# Patient Record
Sex: Female | Born: 1960 | Race: Black or African American | Hispanic: No | Marital: Single | Smoking: Never smoker
Health system: Southern US, Community
[De-identification: ages and names within clinical notes are randomized; demographics above are authoritative.]

## PROBLEM LIST (undated history)

## (undated) DIAGNOSIS — C801 Malignant (primary) neoplasm, unspecified: Secondary | ICD-10-CM

## (undated) HISTORY — PX: BREAST SURGERY: SHX581

## (undated) HISTORY — DX: Malignant (primary) neoplasm, unspecified: C80.1

---

## 2003-03-05 HISTORY — PX: FRACTURE SURGERY: SHX138

## 2012-12-18 ENCOUNTER — Ambulatory Visit: Payer: Federal, State, Local not specified - PPO

## 2012-12-18 ENCOUNTER — Ambulatory Visit (INDEPENDENT_AMBULATORY_CARE_PROVIDER_SITE_OTHER): Payer: Federal, State, Local not specified - PPO | Admitting: Family Medicine

## 2012-12-18 VITALS — BP 136/88 | HR 97 | Temp 98.0°F | Resp 16 | Ht 63.0 in | Wt 246.0 lb

## 2012-12-18 DIAGNOSIS — M549 Dorsalgia, unspecified: Secondary | ICD-10-CM

## 2012-12-18 DIAGNOSIS — R079 Chest pain, unspecified: Secondary | ICD-10-CM

## 2012-12-18 MED ORDER — HYDROCODONE-ACETAMINOPHEN 5-325 MG PO TABS: 1.0000 | ORAL_TABLET | Freq: Three times a day (TID) | ORAL | Status: DC | PRN

## 2012-12-18 MED ORDER — TRAMADOL HCL 50 MG PO TABS: 50.0000 mg | ORAL_TABLET | Freq: Three times a day (TID) | ORAL | Status: DC | PRN

## 2012-12-18 MED ORDER — CYCLOBENZAPRINE HCL 10 MG PO TABS: 10.0000 mg | ORAL_TABLET | Freq: Two times a day (BID) | ORAL | Status: DC | PRN

## 2012-12-18 NOTE — Progress Notes (Signed)
[facility] [address], [place] [address] [phone]  Date:  12/18/2012   Name:  Virginia Wallace   DOB:  10/25/1960   MRN:  [id number]  PCP:  Provider Not In System    Chief Complaint: Arm Pain   History of Present Illness:  Virginia Wallace is a 52 y.o. very pleasant female patient who presents with the following:  She is here today with pain in her back for about 5 days.  She noted onset of pain when she wok up one morning and changed position.   She is a breast cancer survivor (left).  She was treated 15 years ago; she had a left mastectomy/ reconstruction.  She had chemo but no radiation.  Admits she has been worried that this pain could have something to do with her cancer.  Would like a CXR.    She has tried ice and some topical creams for her pain.    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  Sitting for a long time can exacerbate the pain.  The pain also seems to be related to position.  It hurts most when she lays down or when she is active.   No CP, no SOB.  No history of CAD or personal history of heart trouble  She has had tendonitis in the right hand for several months and wonders if this has caused her to change her body mechanics and injure her back.    Her mother died of CHF.  She was 34 when she died.    She has tried tramadol (from a friend) but did not have a good result with this.  Wants to try something else.    There are no active problems to display for this patient.   Past Medical History  Diagnosis Date  . Cancer     breast canser    Past Surgical History  Procedure Laterality Date  . Fracture surgery Right 2005    right ankle  . Breast surgery      History  Substance Use Topics  . Smoking status: Never Smoker   . Smokeless tobacco: Not on file  . Alcohol Use: No    Family History  Problem Relation Age of Onset  . Heart disease Mother   . Cancer Father     No Known Allergies  Medication list has been  reviewed and updated.  No current outpatient prescriptions on file prior to visit.   No current facility-administered medications on file prior to visit.    Review of Systems:  As per HPI- otherwise negative.   Physical Examination: Filed Vitals:   12/18/12 1614  BP: 148/92  Pulse: 103  Temp: 98 F (36.7 C)  Resp: 16   Filed Vitals:   12/18/12 1614  Height: 5\' 3"  (1.6 m)  Weight: 246 lb (111.585 kg)   Body mass index is 43.59 kg/(m^2). Ideal Body Weight: Weight in (lb) to have BMI = 25: 140.8  GEN: WDWN, NAD, Non-toxic, A & O x 3, obese HEENT: Atraumatic, Normocephalic. Neck supple. No masses, No LAD.  Bilateral TM wnl, oropharynx normal.  PEERL,EOMI.   Ears and Nose: No external deformity. CV: RRR, No M/G/R. No JVD. No thrill. No extra heart sounds. PULM: CTA B, no wheezes, crackles, rhonchi. No retractions. No resp. distress. No accessory muscle use. EXTR: No c/c/e NEURO Normal gait.  PSYCH: Normally interactive. Conversant. Not depressed or anxious appearing.  Calm demeanor.  She  is tender in the right upper back, between the spine and the medial border of the scapula.  Able to reproduce her pain by pressing on this area.  No redness, lesion, swelling or heat.  Negative right shoulder with normal ROM  [facility] reading (PRIMARY) by  Dr. Patsy Lager. CXR: clips from prior left mastectomy. Otherwise normal and negative   CLINICAL DATA: Chest and back pain.  EXAM: CHEST 2 VIEW  COMPARISON: None.  FINDINGS: The heart size and mediastinal contours are within normal limits. Both lungs are clear. The visualized skeletal structures are unremarkable. Surgical clips noted from previous left lymph node dissection.  IMPRESSION: No active cardiopulmonary disease.  EKG:  SR.  Non- specific ST changes.  Rate 89 BPM  Assessment and Plan: Pain in back - Plan: DG Chest 2 View, cyclobenzaprine (FLEXERIL) 10 MG tablet, HYDROcodone-acetaminophen (NORCO/VICODIN) 5-325 MG per tablet,  DISCONTINUED: traMADol (ULTRAM) 50 MG tablet  Chest pain - Plan: EKG 12-Lead  Upper back pain.  Will treat with flexeril for presumed muscle pain.  Also gave rx for vicodin to hold- she will use this only if flexeril not sufficient.  Advised her not to use them both together due to sedation risk  Discussed non- specific abnormality of her EKG.  She does not have any CP.  Discussed the fact that sometimes cardiac pain may manifest as pain in other areas of the chest or body.  Offered to facilitate an ED evaluation for cardiac rule- out.  She declines at this time but will seek care if she is not better with the flexeril.   Given her obesity, family history and non- specifically abnormal EKG will refer to cardiology next week for evaluation.  Explained to her that her cardiologist may want to order a stress test.    Of note she specifically denies CP, but I cannot remove this dx as it is associated with her EKG.   Signed Abbe Amsterdam, MD

## 2012-12-18 NOTE — Patient Instructions (Signed)
Use the flexeril as needed for pain and spasm.  You can use the hydrocodone if needed for more severe pain.  Be cautious of sedation especially if you use the medications together.   I will refer you to have a cardiac evaluation to ensure all is ok with your heart.

## 2013-03-24 ENCOUNTER — Ambulatory Visit (INDEPENDENT_AMBULATORY_CARE_PROVIDER_SITE_OTHER): Payer: Federal, State, Local not specified - PPO | Admitting: Emergency Medicine

## 2013-03-24 ENCOUNTER — Ambulatory Visit: Payer: Federal, State, Local not specified - PPO

## 2013-03-24 VITALS — BP 152/98 | HR 106 | Temp 99.3°F | Resp 18 | Ht 62.75 in | Wt 251.4 lb

## 2013-03-24 DIAGNOSIS — R059 Cough, unspecified: Secondary | ICD-10-CM

## 2013-03-24 DIAGNOSIS — J209 Acute bronchitis, unspecified: Secondary | ICD-10-CM

## 2013-03-24 DIAGNOSIS — R05 Cough: Secondary | ICD-10-CM

## 2013-03-24 MED ORDER — ALBUTEROL SULFATE HFA 108 (90 BASE) MCG/ACT IN AERS: 2.0000 | INHALATION_SPRAY | RESPIRATORY_TRACT | Status: AC | PRN

## 2013-03-24 MED ORDER — GUAIFENESIN-CODEINE 100-10 MG/5ML PO SOLN: 5.0000 mL | Freq: Three times a day (TID) | ORAL | Status: DC | PRN

## 2013-03-24 MED ORDER — AZITHROMYCIN 250 MG PO TABS: ORAL_TABLET | ORAL | Status: DC

## 2013-03-24 NOTE — Patient Instructions (Signed)
Metered Dose Inhaler (No Spacer Used) Inhaled medicines are the basis of asthma treatment and other breathing problems. Inhaled medicine can only be effective if used properly. Good technique assures that the medicine reaches the lungs. Metered dose inhalers (MDIs) are used to deliver a variety of inhaled medicines. These include quick relief or rescue medicines (such as bronchodilators) and controller medicines (such as corticosteroids). The medicine is delivered by pushing down on a metal canister to release a set amount of spray. If you are using different kinds of inhalers, use your quick relief medicine to open the airways 10 15 minutes before using a steroid if instructed to do so by your health care provider. If you are unsure which inhalers to use and the order of using them, ask your health care provider, nurse, or respiratory therapist. HOW TO USE THE INHALER 1. Remove cap from inhaler. 2. If you are using the inhaler for the first time, you will need to prime it. Shake the inhaler for 5 seconds and release four puffs into the air, away from your face. Ask your health care provider or pharmacist if you have questions about priming your inhaler. 3. Shake inhaler for 5 seconds before each breath in (inhalation). 4. Position the inhaler so that the top of the canister faces up. 5. Put your index finger on the top of the medicine canister. Your thumb supports the bottom of the inhaler. 6. Open your mouth. 7. Either place the inhaler between your teeth and place your lips tightly around the mouthpiece, or hold the inhaler 1 2 inches away from your open mouth. If you are unsure of which technique to use, ask your health care provider. 8. Breathe out (exhale) normally and as completely as possible. 9. Press the canister down with the index finger to release the medicine. 10. At the same time as the canister is pressed, inhale deeply and slowly until the lungs are completely filled. This should take  4 6 seconds. Keep your tongue down. 11. Hold the medicine in your lungs for up to 5 10 seconds (10 seconds is best). This helps the medicine get into the small airways of your lungs. 12. Breathe out slowly, through pursed lips. Whistling is an example of pursed lips. 13. Wait at least 1 minute between puffs. Continue with the above steps until you have taken the number of puffs your health care provider has ordered. Do not use the inhaler more than your health care provider directs you to. 14. Replace cap on inhaler. 15. Follow the directions from your health care provider or the inhaler insert for cleaning the inhaler. If you are using a steroid inhaler, rinse your mouth with water after your last puff, gargle, and spit out the water. Do not swallow the water. AVOID:  Inhaling before or after starting the spray of medicine. It takes practice to coordinate your breathing with triggering the spray.  Inhaling through the nose (rather than the mouth) when triggering the spray. HOW TO DETERMINE IF YOUR INHALER IS FULL OR NEARLY EMPTY You cannot know when an inhaler is empty by shaking it. A few inhalers are now being made with dose counters. Ask your health care provider for a prescription that has a dose counter if you feel you need that extra help. If your inhaler does not have a counter, ask your health care provider to help you determine the date you need to refill your inhaler. Write the refill date on a calendar or your inhaler canister.  Refill your inhaler 7 10 days before it runs out. Be sure to keep an adequate supply of medicine. This includes making sure it is not expired, and you have a spare inhaler.  SEEK MEDICAL CARE IF:   Symptoms are only partially relieved with your inhaler.  You are having trouble using your inhaler.  You experience some increase in phlegm. SEEK IMMEDIATE MEDICAL CARE IF:   You feel little or no relief with your inhalers. You are still wheezing and are feeling  shortness of breath or tightness in your chest or both.  You have dizziness, headaches, or fast heart rate.  You have chills, fever, or night sweats.  There is a noticeable increase in phlegm production, or there is blood in the phlegm. Document Released: 12/16/2006 Document Revised: 10/21/2012 Document Reviewed: 08/06/2012 [facility] Patient Information 2014 [place], [place].

## 2013-03-24 NOTE — Progress Notes (Signed)
[facility] [address] [place] [address] [phone]  Date:  03/24/2013   Name:  Virginia Wallace   DOB:  [birth year]   MRN:  [id number]  PCP:  Provider Not In System    Chief Complaint: Cough and Headache   History of Present Illness:  Virginia Wallace is a 53 y.o. very pleasant female patient who presents with the following:  Ill for three weeks duration cough with mucopurulent sputum  No wheezing or shortness of breath.  Has intermittent post nasal drainage.  No congestion or nasal drainage.  Cough is worse at night.  Fever one day last week with no chills. No nausea or vomiting.  No stool change or rash.  Has numerous sick contacts.  No improvement with over the counter medications or other home remedies. Denies other complaint or health concern today.   There are no active problems to display for this patient.   Past Medical History  Diagnosis Date  . Cancer     breast canser    Past Surgical History  Procedure Laterality Date  . Fracture surgery Right 2005    right ankle  . Breast surgery      History  Substance Use Topics  . Smoking status: Never Smoker   . Smokeless tobacco: Not on file  . Alcohol Use: No    Family History  Problem Relation Age of Onset  . Heart disease Mother   . Cancer Father     No Known Allergies  Medication list has been reviewed and updated.  Current Outpatient Prescriptions on File Prior to Visit  Medication Sig Dispense Refill  . cyclobenzaprine (FLEXERIL) 10 MG tablet Take 1 tablet (10 mg total) by mouth 2 (two) times daily as needed for muscle spasms.  30 tablet  0  . HYDROcodone-acetaminophen (NORCO/VICODIN) 5-325 MG per tablet Take 1 tablet by mouth every 8 (eight) hours as needed for pain.  20 tablet  0   No current facility-administered medications on file prior to visit.    Review of Systems:  As per HPI, otherwise negative.    Physical Examination: Filed Vitals:   03/24/13 1646  BP:  152/98  Pulse: 106  Temp: 99.3 F (37.4 C)  Resp: 18   Filed Vitals:   03/24/13 1646  Height: 5' 2.75" (1.594 m)  Weight: 251 lb 6.4 oz (114.034 kg)   Body mass index is 44.88 kg/(m^2). Ideal Body Weight: Weight in (lb) to have BMI = 25: 139.7  GEN: veery obese, NAD, Non-toxic, A & O x 3 HEENT: Atraumatic, Normocephalic. Neck supple. No masses, No LAD. Ears and Nose: No external deformity. CV: RRR, No M/G/R. No JVD. No thrill. No extra heart sounds. PULM: CTA B, no wheezes, crackles, rhonchi. No retractions. No resp. distress. No accessory muscle use. ABD: S, NT, ND, +BS. No rebound. No HSM. EXTR: No c/c/e NEURO Normal gait.  PSYCH: Normally interactive. Conversant. Not depressed or anxious appearing.  Calm demeanor.    Assessment and Plan: Bronchitis with bronchospasm Albuterol zpak Robitussin AC   Signed,  Ellison Carwin, MD

## 2020-12-21 ENCOUNTER — Other Ambulatory Visit: Payer: Self-pay

## 2020-12-21 ENCOUNTER — Inpatient Hospital Stay (HOSPITAL_COMMUNITY)
Admission: EM | Admit: 2020-12-21 | Discharge: 2020-12-24 | DRG: 175 | Disposition: A | Discharge status: Home / Self Care | Payer: Federal, State, Local not specified - PPO | Attending: Internal Medicine | Admitting: Internal Medicine

## 2020-12-21 ENCOUNTER — Emergency Department (HOSPITAL_COMMUNITY): Payer: Federal, State, Local not specified - PPO

## 2020-12-21 DIAGNOSIS — Z79899 Other long term (current) drug therapy: Secondary | ICD-10-CM

## 2020-12-21 DIAGNOSIS — E66813 Obesity, class 3: Secondary | ICD-10-CM

## 2020-12-21 DIAGNOSIS — R778 Other specified abnormalities of plasma proteins: Secondary | ICD-10-CM | POA: Diagnosis not present

## 2020-12-21 DIAGNOSIS — Z9013 Acquired absence of bilateral breasts and nipples: Secondary | ICD-10-CM | POA: Diagnosis not present

## 2020-12-21 DIAGNOSIS — R7989 Other specified abnormal findings of blood chemistry: Secondary | ICD-10-CM | POA: Diagnosis present

## 2020-12-21 DIAGNOSIS — I2694 Multiple subsegmental pulmonary emboli without acute cor pulmonale: Secondary | ICD-10-CM

## 2020-12-21 DIAGNOSIS — Z20822 Contact with and (suspected) exposure to covid-19: Secondary | ICD-10-CM | POA: Diagnosis present

## 2020-12-21 DIAGNOSIS — Z6841 Body Mass Index (BMI) 40.0 and over, adult: Secondary | ICD-10-CM

## 2020-12-21 DIAGNOSIS — I2699 Other pulmonary embolism without acute cor pulmonale: Secondary | ICD-10-CM | POA: Diagnosis present

## 2020-12-21 DIAGNOSIS — J4540 Moderate persistent asthma, uncomplicated: Secondary | ICD-10-CM | POA: Diagnosis present

## 2020-12-21 DIAGNOSIS — Z8249 Family history of ischemic heart disease and other diseases of the circulatory system: Secondary | ICD-10-CM | POA: Diagnosis not present

## 2020-12-21 DIAGNOSIS — Z853 Personal history of malignant neoplasm of breast: Secondary | ICD-10-CM

## 2020-12-21 DIAGNOSIS — I2609 Other pulmonary embolism with acute cor pulmonale: Principal | ICD-10-CM | POA: Diagnosis present

## 2020-12-21 LAB — CBC WITH DIFFERENTIAL/PLATELET
Abs Immature Granulocytes: 0.03 10*3/uL (ref 0.00–0.07)
Basophils Absolute: 0 10*3/uL (ref 0.0–0.1)
Basophils Relative: 0 %
Eosinophils Absolute: 0 10*3/uL (ref 0.0–0.5)
Eosinophils Relative: 1 %
HCT: 37.3 % (ref 36.0–46.0)
Hemoglobin: 12.5 g/dL (ref 12.0–15.0)
Immature Granulocytes: 0 %
Lymphocytes Relative: 29 %
Lymphs Abs: 2.5 10*3/uL (ref 0.7–4.0)
MCH: 25.4 pg — ABNORMAL LOW (ref 26.0–34.0)
MCHC: 33.5 g/dL (ref 30.0–36.0)
MCV: 75.8 fL — ABNORMAL LOW (ref 80.0–100.0)
Monocytes Absolute: 0.4 10*3/uL (ref 0.1–1.0)
Monocytes Relative: 5 %
Neutro Abs: 5.6 10*3/uL (ref 1.7–7.7)
Neutrophils Relative %: 65 %
Platelets: 244 10*3/uL (ref 150–400)
RBC: 4.92 MIL/uL (ref 3.87–5.11)
RDW: 14.6 % (ref 11.5–15.5)
WBC: 8.6 10*3/uL (ref 4.0–10.5)
nRBC: 0 % (ref 0.0–0.2)

## 2020-12-21 LAB — COMPREHENSIVE METABOLIC PANEL
ALT: 24 U/L (ref 0–44)
AST: 27 U/L (ref 15–41)
Albumin: 3.8 g/dL (ref 3.5–5.0)
Alkaline Phosphatase: 63 U/L (ref 38–126)
Anion gap: 11 (ref 5–15)
BUN: 10 mg/dL (ref 8–23)
CO2: 26 mmol/L (ref 22–32)
Calcium: 9.5 mg/dL (ref 8.9–10.3)
Chloride: 104 mmol/L (ref 98–111)
Creatinine, Ser: 0.88 mg/dL (ref 0.44–1.00)
GFR, Estimated: >60 mL/min (ref >60)
Glucose, Bld: 134 mg/dL — ABNORMAL HIGH (ref 70–99)
Potassium: 4.1 mmol/L (ref 3.5–5.1)
Sodium: 141 mmol/L (ref 135–145)
Total Bilirubin: 0.5 mg/dL (ref 0.3–1.2)
Total Protein: 7.3 g/dL (ref 6.5–8.1)

## 2020-12-21 LAB — BRAIN NATRIURETIC PEPTIDE: B Natriuretic Peptide: 349.9 pg/mL — ABNORMAL HIGH (ref 0.0–100.0)

## 2020-12-21 LAB — TROPONIN I (HIGH SENSITIVITY): Troponin I (High Sensitivity): 204 ng/L (ref <18)

## 2020-12-21 MED ORDER — ASPIRIN 81 MG PO CHEW
324.0000 mg | CHEWABLE_TABLET | Freq: Once | ORAL | Status: AC
  Administered 2020-12-21: 324 mg via ORAL
  Filled 2020-12-21: qty 4

## 2020-12-21 MED ORDER — HEPARIN (PORCINE) 25000 UT/250ML-% IV SOLN
1250.0000 [IU]/h | INTRAVENOUS | Status: AC
  Administered 2020-12-21: 1350 [IU]/h via INTRAVENOUS
  Administered 2020-12-22 – 2020-12-23 (×2): 1250 [IU]/h via INTRAVENOUS
  Filled 2020-12-21 (×3): qty 250

## 2020-12-21 MED ORDER — HEPARIN BOLUS VIA INFUSION
5500.0000 [IU] | Freq: Once | INTRAVENOUS | Status: AC
  Administered 2020-12-21: 5500 [IU] via INTRAVENOUS
  Filled 2020-12-21: qty 5500

## 2020-12-21 MED ORDER — IOHEXOL 350 MG/ML SOLN
80.0000 mL | Freq: Once | INTRAVENOUS | Status: AC | PRN
  Administered 2020-12-21: 80 mL via INTRAVENOUS

## 2020-12-21 NOTE — Progress Notes (Signed)
ANTICOAGULATION CONSULT NOTE - Initial Consult  Pharmacy Consult for heparin dosing  Indication: NSTEMI vs PE No Known Allergies  Patient Measurements: Height: 5\' 3"  (160 cm) Weight: 114 kg (251 lb 5.2 oz) IBW/kg (Calculated) : 52.4 Heparin Dosing Weight: 80kg  Vital Signs: Temp: 99.1 F (37.3 C) (10/20 2152) Temp Source: Oral (10/20 2152) BP: 125/89 (10/20 2225) Pulse Rate: 95 (10/20 2225)  Labs: Recent Labs    12/21/20 2046  HGB 12.5  HCT 37.3  PLT 244  CREATININE 0.88  TROPONINIHS 204*    Estimated Creatinine Clearance: 81.6 mL/min (by C-G formula based on SCr of 0.88 mg/dL).   Medical History: Past Medical History:  Diagnosis Date   Cancer ([place])    breast canser    Medications:  See MAR  Assessment: Patient presented with shortness of breath and epigastric tenderness. Hx of PE. SCr 0.88, CBC WNL, and troponin 204. No PTA anticoagulation since December 2021, no bleeding noted.   Goal of Therapy:  Heparin level 0.3-0.7 units/ml Monitor platelets by anticoagulation protocol: Yes   Plan:  Give 5500 units bolus x 1 Start heparin infusion at 1350 units/hr Check anti-Xa level in 6 hours and daily while on heparin Continue to monitor H&H and platelets  Donald Pore 12/21/2020,11:00 PM

## 2020-12-21 NOTE — ED Notes (Signed)
Patient oxygen 89-90% RA. Placed pt on 2L O2 per triage RN.

## 2020-12-21 NOTE — ED Triage Notes (Addendum)
Pt reports shortness of breath upon exertion since yesterday. Pt also reports low O2 sats.

## 2020-12-21 NOTE — ED Provider Notes (Signed)
Emergency Medicine Provider Triage Evaluation Note  Virginia Wallace , a 60 y.o. female  was evaluated in triage.  Pt complains of shortness of breath.  Patient is been feeling short of breath for the last week, worsened acutely yesterday.  She is also having some pain in the epigastric area and feels like there is some labor in her chest.  Denies any actual pain or discomfort.  No nausea or vomiting, patient is usually not on oxygen.  She did have supplemental oxygen for almost a year after COVID but was recently taken off it a few months ago.  She does have a history of PE, she has been off anticoagulation since December of last year..  Review of Systems  Positive: Shortness of breath, epigastric tenderness Negative:   Physical Exam  BP (!) 165/121 (BP Location: Left Arm)   Pulse (!) 125   Temp 97.6 F (36.4 C) (Oral)   Resp (!) 24   Ht 5\' 3"  (1.6 m)   Wt 114 kg   SpO2 90%   BMI 44.52 kg/m  Gen:   Awake, no distress   Resp:  Normal effort  MSK:   Moves extremities without difficulty  Other:  Patient is hypoxic in the waiting room and tachycardic to 125.  Medical Decision Making  Medically screening exam initiated at 8:38 PM.  Appropriate orders placed.  Merril Abbe was informed that the remainder of the evaluation will be completed by another provider, this initial triage assessment does not replace that evaluation, and the importance of remaining in the ED until their evaluation is complete.  Concern for PE.  Patient is having epigastric tenderness as well, the pain does not radiate to the back so I do not think there is a dissection.   Sherrill Raring, PA-C 12/21/20 2039    Lorelle Gibbs, DO 12/21/20 2356

## 2020-12-21 NOTE — ED Notes (Signed)
Pt transported to CT ?

## 2020-12-21 NOTE — ED Provider Notes (Signed)
[place] EMERGENCY DEPARTMENT Provider Note   CSN: [id number] Arrival date & time: 12/21/20  2011     History Chief Complaint  Patient presents with   Shortness of Breath    Virginia Wallace is a 60 y.o. female who presents with concern for shortness of breath.  Endorses 5 days of shortness of breath but only 2 days of significant decrease in her stamina.  Significant shortness of breath with even small amounts of exertion or very short distances of ambulation.  States that she was concerned she had a low oxygen saturation.  I personally reviewed this patient's medical records.  She has a history of breast cancer status post chemotherapy and mastectomy.  Additionally had pulmonary emboli a year ago; completed course of anticoagulation is no longer anticoagulated.  HPI     Past Medical History:  Diagnosis Date   Cancer [facility])    breast canser    Patient Active Problem List   Diagnosis Date Noted   Bilateral pulmonary embolism ([place]) 12/21/2020    Past Surgical History:  Procedure Laterality Date   BREAST SURGERY     FRACTURE SURGERY Right 2005   right ankle     OB History   No obstetric history on file.     Family History  Problem Relation Age of Onset   Heart disease Mother    Cancer Father     Social History   Tobacco Use   Smoking status: Never  Substance Use Topics   Alcohol use: No   Drug use: No    Home Medications Prior to Admission medications   Medication Sig Start Date End Date Taking? Authorizing Provider  albuterol (PROVENTIL HFA;VENTOLIN HFA) 108 (90 BASE) MCG/ACT inhaler Inhale 2 puffs into the lungs every 4 (four) hours as needed for wheezing or shortness of breath (cough, shortness of breath or wheezing.). 03/24/13   [name], MD  azithromycin (ZITHROMAX) 250 MG tablet Take 2 tabs PO x 1 dose, then 1 tab PO QD x 4 days 03/24/13   [name], MD  cyclobenzaprine (FLEXERIL) 10 MG tablet Take 1 tablet (10  mg total) by mouth 2 (two) times daily as needed for muscle spasms. 12/18/12   Copland, [name], MD  guaiFENesin-codeine 100-10 MG/5ML syrup Take 5-10 mLs by mouth 3 (three) times daily as needed for cough. 03/24/13   [name], MD  guaiFENesin-dextromethorphan [facility] DM) 100-10 MG/5ML syrup Take 5 mLs by mouth every 4 (four) hours as needed for cough.    [provider]  HYDROcodone-acetaminophen (NORCO/VICODIN) 5-325 MG per tablet Take 1 tablet by mouth every 8 (eight) hours as needed for pain. 12/18/12   Copland, [name], MD    Allergies    Patient has no known allergies.  Review of Systems   Review of Systems  Constitutional:  Positive for activity change and fatigue. Negative for appetite change, chills, diaphoresis and fever.  Respiratory:  Positive for shortness of breath. Negative for cough and chest tightness.   Cardiovascular: Negative.   Gastrointestinal: Negative.   Genitourinary: Negative.   Musculoskeletal: Negative.   Neurological: Negative.    Physical Exam Updated Vital Signs BP (!) 138/97   Pulse 91   Temp 99.1 F (37.3 C) (Oral)   Resp (!) 21   Ht 5\' 3"  (1.6 m)   Wt 114 kg   SpO2 99%   BMI 44.52 kg/m   Physical Exam Vitals and nursing note reviewed.  Constitutional:  Appearance: She is obese. She is not ill-appearing or toxic-appearing.  HENT:     Head: Normocephalic and atraumatic.     Nose: Nose normal.     Mouth/Throat:     Mouth: Mucous membranes are moist.     Pharynx: Oropharynx is clear. Uvula midline. No oropharyngeal exudate or posterior oropharyngeal erythema.     Tonsils: No tonsillar exudate.  Eyes:     General: Lids are normal. Vision grossly intact.        Right eye: No discharge.        Left eye: No discharge.     Extraocular Movements: Extraocular movements intact.     Conjunctiva/sclera: Conjunctivae normal.     Pupils: Pupils are equal, round, and reactive to light.  Neck:     Trachea: Trachea and  phonation normal.  Cardiovascular:     Rate and Rhythm: Regular rhythm. Tachycardia present.     Pulses: Normal pulses.     Heart sounds: Normal heart sounds. No murmur heard. Pulmonary:     Effort: Pulmonary effort is normal. No tachypnea, bradypnea, accessory muscle usage, prolonged expiration or respiratory distress.     Breath sounds: Normal breath sounds. No wheezing or rales.  Chest:     Chest wall: No mass, lacerations, deformity, swelling, tenderness, crepitus or edema.  Abdominal:     General: Bowel sounds are normal. There is no distension.     Palpations: Abdomen is soft.     Tenderness: There is no abdominal tenderness. There is no right CVA tenderness, left CVA tenderness, guarding or rebound.  Musculoskeletal:        General: No deformity.     Cervical back: Normal range of motion and neck supple. No edema, rigidity or crepitus. No pain with movement, spinous process tenderness or muscular tenderness.     Right lower leg: No edema.     Left lower leg: No edema.  Lymphadenopathy:     Cervical: No cervical adenopathy.  Skin:    General: Skin is warm and dry.     Capillary Refill: Capillary refill takes less than 2 seconds.  Neurological:     General: No focal deficit present.     Mental Status: She is alert and oriented to person, place, and time. Mental status is at baseline.     GCS: GCS eye subscore is 4. GCS verbal subscore is 5. GCS motor subscore is 6.     Gait: Gait is intact. Gait normal.  Psychiatric:        Mood and Affect: Mood normal.    ED Results / Procedures / Treatments   Labs (all labs ordered are listed, but only abnormal results are displayed) Labs Reviewed  COMPREHENSIVE METABOLIC PANEL - Abnormal; Notable for the following components:      Result Value   Glucose, Bld 134 (*)    All other components within normal limits  CBC WITH DIFFERENTIAL/PLATELET - Abnormal; Notable for the following components:   MCV 75.8 (*)    MCH 25.4 (*)    All  other components within normal limits  BRAIN NATRIURETIC PEPTIDE - Abnormal; Notable for the following components:   B Natriuretic Peptide 349.9 (*)    All other components within normal limits  TROPONIN I (HIGH SENSITIVITY) - Abnormal; Notable for the following components:   Troponin I (High Sensitivity) 204 (*)    All other components within normal limits  TROPONIN I (HIGH SENSITIVITY) - Abnormal; Notable for the following components:   Troponin I (  High Sensitivity) 220 (*)    All other components within normal limits  RESP PANEL BY RT-PCR (FLU A&B, COVID) ARPGX2  HEPARIN LEVEL (UNFRACTIONATED)  CBC  PROTIME-INR    EKG EKG Interpretation  Date/Time:  Thursday December 21 2020 20:39:18 EDT Ventricular Rate:  112 PR Interval:  164 QRS Duration: 76 QT Interval:  342 QTC Calculation: 466 R Axis:   -31 Text Interpretation: Sinus tachycardia Possible Left atrial enlargement Left axis deviation Pulmonary disease pattern T wave abnormality, consider anterior ischemia Abnormal ECG No previous ECGs available Confirmed by Fredia Sorrow [phone]) on 12/21/2020 10:13:20 PM  Radiology CT Angio Chest PE W and/or Wo Contrast  Result Date: 12/21/2020 CLINICAL DATA:  PE suspected, high prob EXAM: CT ANGIOGRAPHY CHEST WITH CONTRAST TECHNIQUE: Multidetector CT imaging of the chest was performed using the standard protocol during bolus administration of intravenous contrast. Multiplanar CT image reconstructions and MIPs were obtained to evaluate the vascular anatomy. CONTRAST:  67mL OMNIPAQUE IOHEXOL 350 MG/ML SOLN COMPARISON:  None. FINDINGS: Cardiovascular: There are numerous bilateral pulmonary emboli involving all lobes of both lungs. Evidence of right heart strain with RV to LV ratio 1.59. Aorta normal caliber. Mediastinum/Nodes: No mediastinal, hilar, or axillary adenopathy. Trachea and esophagus are unremarkable. Lungs/Pleura: No confluent airspace opacities. Linear atelectasis in the lung bases.  No effusions. Upper Abdomen: Imaging into the upper abdomen demonstrates no acute findings. Musculoskeletal: Prior right mastectomy.  No acute bony abnormality. Review of the MIP images confirms the above findings. IMPRESSION: Bilateral pulmonary emboli. Evidence of right heart strain (RV/LV Ratio = 1.59) consistent with at least submassive (intermediate risk) PE. The presence of right heart strain has been associated with an increased risk of morbidity and mortality. Please refer to the "PE Focused" order set in EPIC. Bibasilar atelectasis. Critical Value/emergent results were called by telephone at the time of interpretation on 12/21/2020 at 11:24 pm to provider Joline Maxcy , who verbally acknowledged these results. Electronically Signed   By: Rolm Baptise M.D.   On: 12/21/2020 23:29    Procedures .Critical Care Performed by: Emeline Darling, PA-C Authorized by: Emeline Darling, PA-C   Critical care provider statement:    Critical care time (minutes):  45   Critical care was necessary to treat or prevent imminent or life-threatening deterioration of the following conditions: Submassive PE with right heart strain.   Critical care was time spent personally by me on the following activities:  Development of treatment plan with patient or surrogate, discussions with consultants, evaluation of patient's response to treatment, examination of patient, obtaining history from patient or surrogate, ordering and performing treatments and interventions, ordering and review of laboratory studies, ordering and review of radiographic studies, pulse oximetry and re-evaluation of patient's condition   Medications Ordered in ED Medications  heparin ADULT infusion 100 units/mL (25000 units/269mL) (1,350 Units/hr Intravenous New Bag/Given 12/21/20 2331)  aspirin chewable tablet 324 mg (324 mg Oral Given 12/21/20 2257)  iohexol (OMNIPAQUE) 350 MG/ML injection 80 mL (80 mLs Intravenous Contrast Given  12/21/20 2319)  heparin bolus via infusion 5,500 Units (5,500 Units Intravenous Bolus from Bag 12/21/20 2331)    ED Course  I have reviewed the triage vital signs and the nursing notes.  Pertinent labs & imaging results that were available during my care of the patient were reviewed by me and considered in my medical decision making (see chart for details).  Clinical Course as of 12/22/20 0025  Thu Dec 21, 2020  2323 Patient ambulated to the  bathroom and became exceptionally short of breath, desatted to the 80s.  Recovered her saturations on 3 L submental oxygen by nasal cannula.  Still feeling somewhat short of breath but no chest pain. [RS]  2338 Call from ED PA Cal[place] who reported she received critical results from radiologist regarding this patient.  Patient has bilateral pulmonary emboli; submassive PE with right heart strain on her CT angiogram.  I called ED pharmacist to confirm that patient is on the appropriate heparin dosage for PE. [RS]  2353 Consult to hospitalist, Dr. Cyd Silence, who is agreeable to seeing the patient and admitting her to his service today appreciate his collaboration in the care of this patient. [RS]    Clinical Course User Index [RS] Valoria Tamburri, Sharlene Dory   MDM Rules/Calculators/A&P                         60 year old female who presents with shortness of breath x5 days worse in the last 2 days.  DDx includes but is not limited to ACS, PE, pleural effusion, pneumothorax, pneumonia, reactive airway disease, CHF exacerbation.  CBC unremarkable, CMP unremarkable,  BNP mildly elevated to 349.  Troponin significantly elevated 204.  Patient has been chest pain-free throughout her course of symptomatology.  Additionally is tachycardic and tachypneic throughout her stay in the ED.  Favor diagnosis of PE.  CT angiogram ordered; Heparin ordered per pharmacy consult with instructions of high suspicion of PE.  Critical result with submassive bilateral  pulmonary emboli with right heart strain on CT angiogram.  Patient pending admission consult at this time.  Heparin has been started.  Consult to hospitalist as above.  Medical evaluation and treatment plan.  Each of her questions was answered to her expressed satisfaction.  Consent will plan for management this time.    This chart was dictated using voice recognition software, Dragon. Despite the best efforts of this provider to proofread and correct errors, errors may still occur which can change documentation meaning.  Final Clinical Impression(s) / ED Diagnoses Final diagnoses:  None    Rx / DC Orders ED Discharge Orders     None        Emeline Darling, PA-C 12/22/20 Crista Elliot, MD 12/27/20 [phone]

## 2020-12-22 ENCOUNTER — Encounter (HOSPITAL_COMMUNITY): Payer: Self-pay | Admitting: Internal Medicine

## 2020-12-22 ENCOUNTER — Inpatient Hospital Stay (HOSPITAL_COMMUNITY): Payer: Federal, State, Local not specified - PPO

## 2020-12-22 DIAGNOSIS — I2609 Other pulmonary embolism with acute cor pulmonale: Principal | ICD-10-CM

## 2020-12-22 DIAGNOSIS — R778 Other specified abnormalities of plasma proteins: Secondary | ICD-10-CM | POA: Diagnosis not present

## 2020-12-22 DIAGNOSIS — J4540 Moderate persistent asthma, uncomplicated: Secondary | ICD-10-CM | POA: Diagnosis not present

## 2020-12-22 DIAGNOSIS — R7989 Other specified abnormal findings of blood chemistry: Secondary | ICD-10-CM | POA: Diagnosis present

## 2020-12-22 DIAGNOSIS — Z6841 Body Mass Index (BMI) 40.0 and over, adult: Secondary | ICD-10-CM

## 2020-12-22 DIAGNOSIS — I2699 Other pulmonary embolism without acute cor pulmonale: Secondary | ICD-10-CM

## 2020-12-22 HISTORY — DX: Moderate persistent asthma, uncomplicated: J45.40

## 2020-12-22 HISTORY — DX: Morbid (severe) obesity due to excess calories: E66.01

## 2020-12-22 HISTORY — DX: Body Mass Index (BMI) 40.0 and over, adult: Z684

## 2020-12-22 LAB — CBC
HCT: 33.9 % — ABNORMAL LOW (ref 36.0–46.0)
Hemoglobin: 11.6 g/dL — ABNORMAL LOW (ref 12.0–15.0)
MCH: 25.5 pg — ABNORMAL LOW (ref 26.0–34.0)
MCHC: 34.2 g/dL (ref 30.0–36.0)
MCV: 74.5 fL — ABNORMAL LOW (ref 80.0–100.0)
Platelets: 205 10*3/uL (ref 150–400)
RBC: 4.55 MIL/uL (ref 3.87–5.11)
RDW: 14.6 % (ref 11.5–15.5)
WBC: 8.5 10*3/uL (ref 4.0–10.5)
nRBC: 0 % (ref 0.0–0.2)

## 2020-12-22 LAB — RESP PANEL BY RT-PCR (FLU A&B, COVID) ARPGX2
Influenza A by PCR: NEGATIVE
Influenza B by PCR: NEGATIVE
SARS Coronavirus 2 by RT PCR: NEGATIVE

## 2020-12-22 LAB — PROTIME-INR
INR: 1 (ref 0.8–1.2)
Prothrombin Time: 12.9 seconds (ref 11.4–15.2)

## 2020-12-22 LAB — COMPREHENSIVE METABOLIC PANEL
ALT: 20 U/L (ref 0–44)
AST: 20 U/L (ref 15–41)
Albumin: 3.3 g/dL — ABNORMAL LOW (ref 3.5–5.0)
Alkaline Phosphatase: 53 U/L (ref 38–126)
Anion gap: 10 (ref 5–15)
BUN: 8 mg/dL (ref 8–23)
CO2: 26 mmol/L (ref 22–32)
Calcium: 8.9 mg/dL (ref 8.9–10.3)
Chloride: 104 mmol/L (ref 98–111)
Creatinine, Ser: 0.76 mg/dL (ref 0.44–1.00)
GFR, Estimated: >60 mL/min (ref >60)
Glucose, Bld: 104 mg/dL — ABNORMAL HIGH (ref 70–99)
Potassium: 3.6 mmol/L (ref 3.5–5.1)
Sodium: 140 mmol/L (ref 135–145)
Total Bilirubin: 0.9 mg/dL (ref 0.3–1.2)
Total Protein: 6.5 g/dL (ref 6.5–8.1)

## 2020-12-22 LAB — ECHOCARDIOGRAM COMPLETE
Area-P 1/2: 3.53 cm2
Height: 63 in
S' Lateral: 2.2 cm
Weight: 4021.19 oz

## 2020-12-22 LAB — HIV ANTIBODY (ROUTINE TESTING W REFLEX): HIV Screen 4th Generation wRfx: NONREACTIVE

## 2020-12-22 LAB — HEPARIN LEVEL (UNFRACTIONATED)
Heparin Unfractionated: 0.9 IU/mL — ABNORMAL HIGH (ref 0.30–0.70)
Heparin Unfractionated: 0.62 IU/mL (ref 0.30–0.70)

## 2020-12-22 LAB — TROPONIN I (HIGH SENSITIVITY): Troponin I (High Sensitivity): 220 ng/L (ref <18)

## 2020-12-22 LAB — MAGNESIUM: Magnesium: 1.6 mg/dL — ABNORMAL LOW (ref 1.7–2.4)

## 2020-12-22 MED ORDER — ACETAMINOPHEN 650 MG RE SUPP: 650.0000 mg | Freq: Four times a day (QID) | RECTAL | Status: DC | PRN

## 2020-12-22 MED ORDER — FERROUS SULFATE 325 (65 FE) MG PO TABS
325.0000 mg | ORAL_TABLET | Freq: Every day | ORAL | Status: DC
  Administered 2020-12-23 – 2020-12-24 (×2): 325 mg via ORAL
  Filled 2020-12-22 (×2): qty 1

## 2020-12-22 MED ORDER — ONDANSETRON HCL 4 MG PO TABS: 4.0000 mg | ORAL_TABLET | Freq: Four times a day (QID) | ORAL | Status: DC | PRN

## 2020-12-22 MED ORDER — ACETAMINOPHEN 325 MG PO TABS: 650.0000 mg | ORAL_TABLET | Freq: Four times a day (QID) | ORAL | Status: DC | PRN

## 2020-12-22 MED ORDER — MORPHINE SULFATE (PF) 2 MG/ML IV SOLN: 2.0000 mg | INTRAVENOUS | Status: DC | PRN

## 2020-12-22 MED ORDER — MELATONIN 5 MG PO TABS
10.0000 mg | ORAL_TABLET | Freq: Every evening | ORAL | Status: DC | PRN
Start: 2020-12-22 — End: 2020-12-24

## 2020-12-22 MED ORDER — POLYETHYLENE GLYCOL 3350 17 G PO PACK: 17.0000 g | PACK | Freq: Every day | ORAL | Status: DC | PRN

## 2020-12-22 MED ORDER — ONDANSETRON HCL 4 MG/2ML IJ SOLN: 4.0000 mg | Freq: Four times a day (QID) | INTRAMUSCULAR | Status: DC | PRN

## 2020-12-22 MED ORDER — ALBUTEROL SULFATE (2.5 MG/3ML) 0.083% IN NEBU
2.5000 mg | INHALATION_SOLUTION | RESPIRATORY_TRACT | Status: DC | PRN
Start: 2020-12-22 — End: 2020-12-24

## 2020-12-22 MED ORDER — FLUTICASONE FUROATE-VILANTEROL 100-25 MCG/ACT IN AEPB
1.0000 | INHALATION_SPRAY | Freq: Every day | RESPIRATORY_TRACT | Status: DC
  Administered 2020-12-23 – 2020-12-24 (×2): 1 via RESPIRATORY_TRACT
  Filled 2020-12-22 (×2): qty 28

## 2020-12-22 MED ORDER — MAGNESIUM SULFATE 2 GM/50ML IV SOLN
2.0000 g | Freq: Once | INTRAVENOUS | Status: AC
  Administered 2020-12-22: 2 g via INTRAVENOUS
  Filled 2020-12-22: qty 50

## 2020-12-22 MED ORDER — MAGNESIUM OXIDE -MG SUPPLEMENT 400 (240 MG) MG PO TABS
400.0000 mg | ORAL_TABLET | Freq: Every day | ORAL | Status: DC
  Administered 2020-12-22 – 2020-12-24 (×3): 400 mg via ORAL
  Filled 2020-12-22 (×3): qty 1

## 2020-12-22 MED ORDER — OXYCODONE-ACETAMINOPHEN 5-325 MG PO TABS: 1.0000 | ORAL_TABLET | ORAL | Status: DC | PRN

## 2020-12-22 NOTE — Assessment & Plan Note (Addendum)
   CT angiogram reveals evidence of bilateral pulmonary emboli with evidence of right heart strain.  Patient is currently hemodynamically stable and is on minimal oxygen supplementation via nasal cannula.  No need for emergent thrombectomy or thrombolysis at this point.  Patient has been placed on Heparin infusion by ER provider which will be continued at this time and eventually will be transition to long-term oral anticoagulation.  Concerning potential etiologies patient does endorse frequently driving back and forth between [place] (90 minutes) but otherwise denies recent lengthy trips, recent medical procedures, recent unexplained weight loss or night sweats.  Patient states that she has been following with oncology regularly and has had no evidence of recurrent cancer on surveillance.  Patient possibly suffers from an underlying hypercoagulable state considering this is the second time patient has developed pulmonary emboli.  Patient will likely require lifelong anticoagulation.  Echocardiogram will be obtained in the morning, if right heart strain is once again demonstrated will obtain PCCM consultation for evaluation of potential thrombolysis or thrombectomy.    Monitoring patient on telemetry  Monitoring cardiac enzymes which are somewhat elevated due to heart strain  Supplemental oxygen to maintain oxygen saturations between 92 to 96%.  As needed opiate-based analgesics for associated chest pain

## 2020-12-22 NOTE — ED Notes (Signed)
Lunch ordered 

## 2020-12-22 NOTE — Assessment & Plan Note (Signed)
?   No evidence of acute asthma exacerbation ?? As needed bronchodilator therapy for shortness of breath and wheezing. ? ?

## 2020-12-22 NOTE — Progress Notes (Signed)
ANTICOAGULATION CONSULT NOTE Pharmacy Consult for heparin   Indication:  PE No Known Allergies  Patient Measurements: Height: 5\' 3"  (160 cm) Weight: 105.3 kg (232 lb 2.3 oz) IBW/kg (Calculated) : 52.4 Heparin Dosing Weight: 80kg  Vital Signs: Temp: 97.7 F (36.5 C) (10/21 1338) Temp Source: Oral (10/21 1338) BP: 110/74 (10/21 1338) Pulse Rate: 98 (10/21 1338)  Labs: Recent Labs    12/21/20 2046 12/21/20 2238 12/22/20 0337 12/22/20 0530 12/22/20 1330  HGB 12.5  --  11.6*  --   --   HCT 37.3  --  33.9*  --   --   PLT 244  --  205  --   --   LABPROT  --   --   --   --  12.9  INR  --   --   --   --  1.0  HEPARINUNFRC  --   --   --  0.90* 0.62  CREATININE 0.88  --  0.76  --   --   TROPONINIHS 204* 220*  --   --   --      Estimated Creatinine Clearance: 85.8 mL/min (by C-G formula based on SCr of 0.76 mg/dL).  Assessment: 60 y.o. female with bilateral PE for heparin Heparin level therapeutic   Goal of Therapy:  Heparin level 0.3-0.7 units/ml Monitor platelets by anticoagulation protocol: Yes   Plan:  Continue heparin at 1250 units / hr Daily heparin level, CBC  Thank you Anette Guarneri, PharmD   12/22/2020,2:51 PM

## 2020-12-22 NOTE — Progress Notes (Signed)
ANTICOAGULATION CONSULT NOTE Pharmacy Consult for heparin   Indication:  PE No Known Allergies  Patient Measurements: Height: 5\' 3"  (160 cm) Weight: 114 kg (251 lb 5.2 oz) IBW/kg (Calculated) : 52.4 Heparin Dosing Weight: 80kg  Vital Signs: Temp: 99.1 F (37.3 C) (10/20 2152) Temp Source: Oral (10/20 2152) BP: 107/78 (10/21 0400) Pulse Rate: 85 (10/21 0400)  Labs: Recent Labs    12/21/20 2046 12/21/20 2238 12/22/20 0337 12/22/20 0530  HGB 12.5  --  11.6*  --   HCT 37.3  --  33.9*  --   PLT 244  --  205  --   HEPARINUNFRC  --   --   --  0.90*  CREATININE 0.88  --  0.76  --   TROPONINIHS 204* 220*  --   --      Estimated Creatinine Clearance: 89.8 mL/min (by C-G formula based on SCr of 0.76 mg/dL).  Assessment: 60 y.o. female with bilateral PE for heparin  Goal of Therapy:  Heparin level 0.3-0.7 units/ml Monitor platelets by anticoagulation protocol: Yes   Plan:  Decrease Heparin 1250 units/hr Check heparin level in 6 hours.   Caryl Pina 12/22/2020,6:26 AM

## 2020-12-22 NOTE — Assessment & Plan Note (Signed)
.   Once patient is clinically improving, will counsel patient on caloric restriction and regular physical activity.   

## 2020-12-22 NOTE — Progress Notes (Signed)
  Echocardiogram 2D Echocardiogram has been performed.  Virginia Wallace 12/22/2020, 9:22 AM

## 2020-12-22 NOTE — Assessment & Plan Note (Addendum)
   Considering presence of submassive pulmonary emboli elevated troponin is likely secondary to heart strain  Plaque rupture is therefore felt to be unlikely and cardiac intervention is not indicated  Troponin is already trending downward  Patient is currently chest pain-free  Monitoring patient on telemetry

## 2020-12-22 NOTE — Progress Notes (Signed)
Lower extremity venous bilateral study completed.   Please see CV Proc for preliminary results.   Orien Mayhall, RDMS, RVT  

## 2020-12-22 NOTE — Progress Notes (Signed)
PROGRESS NOTE        PATIENT DETAILS Name: Virginia Wallace Age: 60 y.o. Sex: female Date of Birth: 02/17/1961 Admit Date: 12/21/2020 Admitting Physician Vernelle Emerald, MD VBT:YOMAY, Virginia Jenny, MD  Brief Narrative: Patient is a 60 y.o. female with history of left sided breast cancer (1991)-s/p mastectomy and subsequent reconstructive surgery-right breast cancer (2021)-s/p adjuvant chemotherapy and mastectomy-history of pulmonary embolism (June 2021-stopped Eliquis December 2021)-presenting with exertional dyspnea-found to have pulmonary embolism.  See below for further details  Subjective: Lying comfortably in bed-denies any chest pain or shortness of breath at rest.  Acknowledges exertional dyspnea.  Objective: Vitals: Blood pressure 123/67, pulse 86, temperature 99.1 F (37.3 C), temperature source Oral, resp. rate 17, height 5\' 3"  (1.6 m), weight 114 kg, SpO2 99 %.   Exam: Gen Exam:Alert awake-not in any distress HEENT:atraumatic, normocephalic Chest: B/L clear to auscultation anteriorly CVS:S1S2 regular Abdomen:soft non tender, non distended Extremities:no edema Neurology: Non focal Skin: no rash  Pertinent Labs/Radiology: WBC: 0.5 Hb: 11.6 Na: 140 K: 3.6 Creatinine: 0.76 Mg: 1.6  10/20>> CT angio chest: Bilateral pulmonary emboli with evidence of RV strain. 10/21>> lower extremity Doppler: No DVT 10/21>> Echo: Pending  Assessment/Plan: Pulmonary embolism: Second episode of VTE-seems to be unprovoked.  Although patient has CT evidence of RV strain-clinically she does not have any evidence of RV failure.  She is only requiring 2 L of oxygen-in fact went O2 was removed by me this morning her O2 saturation was in the mid to low 90s.  Plan is to continue with IV heparin for another 24 hours-and switch her to oral anticoagulation tomorrow.  Patient is aware that she will now need indefinite anticoagulation.  Minimally elevated troponins: Trend is  flat-not consistent with ACS-likely due to PE.  Hypomagnesemia: Replete and recheck  History of breast cancer: In remission-resume outpatient follow-up with oncology as previous  Moderate persistent asthma: No evidence of exacerbation-continue bronchodilators.  Morbid Obesity: Estimated body mass index is 44.52 kg/m as calculated from the following:   Height as of this encounter: 5\' 3"  (1.6 m).   Weight as of this encounter: 114 kg.     Procedures: None Consults: None DVT Prophylaxis: IV Heparin Code Status:Full code  Family Communication: None at bedside  Time spent: 35 minutes-Greater than 50% of this time was spent in counseling, explanation of diagnosis, planning of further management, and coordination of care.  Diet: Diet Order             Diet Heart Room service appropriate? Yes; Fluid consistency: Thin  Diet effective now                      Disposition Plan: Status is: Inpatient  Remains inpatient appropriate because: As inpatient rehab level of treatment for submassive PE.   Barriers to Discharge: Submassive PE-needs parenteral anticoagulation for at least 48 hours before transitioning to oral anticoagulation.  Needs close monitoring in case patient develops RV failure.  Antimicrobial agents: Anti-infectives (From admission, onward)    None        MEDICATIONS: Scheduled Meds: Continuous Infusions:  heparin 1,250 Units/hr (12/22/20 0631)   PRN Meds:.acetaminophen **OR** acetaminophen, albuterol, melatonin, oxyCODONE-acetaminophen **OR** morphine injection, ondansetron **OR** ondansetron (ZOFRAN) IV, polyethylene glycol   I have personally reviewed following labs and imaging studies  LABORATORY DATA: CBC: Recent Labs  Lab  12/21/20 2046 12/22/20 0337  WBC 8.6 8.5  NEUTROABS 5.6  --   HGB 12.5 11.6*  HCT 37.3 33.9*  MCV 75.8* 74.5*  PLT 244 387    Basic Metabolic Panel: Recent Labs  Lab 12/21/20 2046 12/22/20 0337  NA 141 140   K 4.1 3.6  CL 104 104  CO2 26 26  GLUCOSE 134* 104*  BUN 10 8  CREATININE 0.88 0.76  CALCIUM 9.5 8.9  MG  --  1.6*    GFR: Estimated Creatinine Clearance: 89.8 mL/min (by C-G formula based on SCr of 0.76 mg/dL).  Liver Function Tests: Recent Labs  Lab 12/21/20 2046 12/22/20 0337  AST 27 20  ALT 24 20  ALKPHOS 63 53  BILITOT 0.5 0.9  PROT 7.3 6.5  ALBUMIN 3.8 3.3*   No results for input(s): LIPASE, AMYLASE in the last 168 hours. No results for input(s): AMMONIA in the last 168 hours.  Coagulation Profile: No results for input(s): INR, PROTIME in the last 168 hours.  Cardiac Enzymes: No results for input(s): CKTOTAL, CKMB, CKMBINDEX, TROPONINI in the last 168 hours.  BNP (last 3 results) No results for input(s): PROBNP in the last 8760 hours.  Lipid Profile: No results for input(s): CHOL, HDL, LDLCALC, TRIG, CHOLHDL, LDLDIRECT in the last 72 hours.  Thyroid Function Tests: No results for input(s): TSH, T4TOTAL, FREET4, T3FREE, THYROIDAB in the last 72 hours.  Anemia Panel: No results for input(s): VITAMINB12, FOLATE, FERRITIN, TIBC, IRON, RETICCTPCT in the last 72 hours.  Urine analysis: No results found for: COLORURINE, APPEARANCEUR, LABSPEC, PHURINE, GLUCOSEU, HGBUR, BILIRUBINUR, KETONESUR, PROTEINUR, UROBILINOGEN, NITRITE, LEUKOCYTESUR  Sepsis Labs: Lactic Acid, Venous No results found for: LATICACIDVEN  MICROBIOLOGY: Recent Results (from the past 240 hour(s))  Resp Panel by RT-PCR (Flu A&B, Covid) Nasopharyngeal Swab     Status: None   Collection Time: 12/21/20 11:39 PM   Specimen: Nasopharyngeal Swab; Nasopharyngeal(NP) swabs in vial transport medium  Result Value Ref Range Status   SARS Coronavirus 2 by RT PCR NEGATIVE NEGATIVE Final    Comment: (NOTE) SARS-CoV-2 target nucleic acids are NOT DETECTED.  The SARS-CoV-2 RNA is generally detectable in upper respiratory specimens during the acute phase of infection. The lowest concentration of  SARS-CoV-2 viral copies this assay can detect is 138 copies/mL. A negative result does not preclude SARS-Cov-2 infection and should not be used as the sole basis for treatment or other patient management decisions. A negative result may occur with  improper specimen collection/handling, submission of specimen other than nasopharyngeal swab, presence of viral mutation(s) within the areas targeted by this assay, and inadequate number of viral copies(<138 copies/mL). A negative result must be combined with clinical observations, patient history, and epidemiological information. The expected result is Negative.  Fact Sheet for Patients:  EntrepreneurPulse.com.au  Fact Sheet for Healthcare Providers:  IncredibleEmployment.be  This test is no t yet approved or cleared by the [place] FDA and  has been authorized for detection and/or diagnosis of SARS-CoV-2 by FDA under an Emergency Use Authorization (EUA). This EUA will remain  in effect (meaning this test can be used) for the duration of the COVID-19 declaration under Section 564(b)(1) of the Act, 21 U.S.C.section 360bbb-3(b)(1), unless the authorization is terminated  or revoked sooner.       Influenza A by PCR NEGATIVE NEGATIVE Final   Influenza B by PCR NEGATIVE NEGATIVE Final    Comment: (NOTE) The Xpert Xpress SARS-CoV-2/FLU/RSV plus assay is intended as an aid in the diagnosis of  influenza from Nasopharyngeal swab specimens and should not be used as a sole basis for treatment. Nasal washings and aspirates are unacceptable for Xpert Xpress SARS-CoV-2/FLU/RSV testing.  Fact Sheet for Patients: EntrepreneurPulse.com.au  Fact Sheet for Healthcare Providers: IncredibleEmployment.be  This test is not yet approved or cleared by the [place] FDA and has been authorized for detection and/or diagnosis of SARS-CoV-2 by FDA under an Emergency Use  Authorization (EUA). This EUA will remain in effect (meaning this test can be used) for the duration of the COVID-19 declaration under Section 564(b)(1) of the Act, 21 U.S.C. section 360bbb-3(b)(1), unless the authorization is terminated or revoked.  Performed at [facility], [place] [address], [place], [place] [address]     RADIOLOGY STUDIES/RESULTS: CT Angio Chest PE W and/or Wo Contrast  Result Date: 12/21/2020 CLINICAL DATA:  PE suspected, high prob EXAM: CT ANGIOGRAPHY CHEST WITH CONTRAST TECHNIQUE: Multidetector CT imaging of the chest was performed using the standard protocol during bolus administration of intravenous contrast. Multiplanar CT image reconstructions and MIPs were obtained to evaluate the vascular anatomy. CONTRAST:  2mL OMNIPAQUE IOHEXOL 350 MG/ML SOLN COMPARISON:  None. FINDINGS: Cardiovascular: There are numerous bilateral pulmonary emboli involving all lobes of both lungs. Evidence of right heart strain with RV to LV ratio 1.59. Aorta normal caliber. Mediastinum/Nodes: No mediastinal, hilar, or axillary adenopathy. Trachea and esophagus are unremarkable. Lungs/Pleura: No confluent airspace opacities. Linear atelectasis in the lung bases. No effusions. Upper Abdomen: Imaging into the upper abdomen demonstrates no acute findings. Musculoskeletal: Prior right mastectomy.  No acute bony abnormality. Review of the MIP images confirms the above findings. IMPRESSION: Bilateral pulmonary emboli. Evidence of right heart strain (RV/LV Ratio = 1.59) consistent with at least submassive (intermediate risk) PE. The presence of right heart strain has been associated with an increased risk of morbidity and mortality. Please refer to the "PE Focused" order set in EPIC. Bibasilar atelectasis. Critical Value/emergent results were called by telephone at the time of interpretation on 12/21/2020 at 11:24 pm to provider Joline Maxcy , who verbally acknowledged these results. Electronically  Signed   By: Rolm Baptise M.D.   On: 12/21/2020 23:29   VAS [place] LOWER EXTREMITY VENOUS (DVT)  Result Date: 12/22/2020  Lower Venous DVT Study Patient Name:  Virginia Wallace  Date of Exam:   12/22/2020 Medical Rec #: [id number]       Accession #:    [id number] Date of Birth: Nov 06, 1960       Patient Gender: F Patient Age:   76 years Exam Location:  [facility] Procedure:      VAS [place] LOWER EXTREMITY VENOUS (DVT) Referring Phys: Oren Binet --------------------------------------------------------------------------------  Indications: Pulmonary embolism.  Anticoagulation: Heparin. Comparison Study: No prior studies. Performing Technologist: Darlin Coco RDMS, RVT  Examination Guidelines: A complete evaluation includes B-mode imaging, spectral Doppler, color Doppler, and power Doppler as needed of all accessible portions of each vessel. Bilateral testing is considered an integral part of a complete examination. Limited examinations for reoccurring indications may be performed as noted. The reflux portion of the exam is performed with the patient in reverse Trendelenburg.  +---------+---------------+---------+-----------+----------+--------------+ RIGHT    CompressibilityPhasicitySpontaneityPropertiesThrombus Aging +---------+---------------+---------+-----------+----------+--------------+ CFV      Full           Yes      Yes                                 +---------+---------------+---------+-----------+----------+--------------+  SFJ      Full                                                        +---------+---------------+---------+-----------+----------+--------------+ FV Prox  Full                                                        +---------+---------------+---------+-----------+----------+--------------+ FV Mid   Full                                                        +---------+---------------+---------+-----------+----------+--------------+ FV DistalFull                                                         +---------+---------------+---------+-----------+----------+--------------+ PFV      Full                                                        +---------+---------------+---------+-----------+----------+--------------+ POP      Full           Yes      Yes                                 +---------+---------------+---------+-----------+----------+--------------+ PTV      Full                                                        +---------+---------------+---------+-----------+----------+--------------+ PERO     Full                                                        +---------+---------------+---------+-----------+----------+--------------+   +---------+---------------+---------+-----------+----------+--------------+ LEFT     CompressibilityPhasicitySpontaneityPropertiesThrombus Aging +---------+---------------+---------+-----------+----------+--------------+ CFV      Full           Yes      Yes                                 +---------+---------------+---------+-----------+----------+--------------+ SFJ      Full                                                        +---------+---------------+---------+-----------+----------+--------------+  FV Prox  Full                                                        +---------+---------------+---------+-----------+----------+--------------+ FV Mid   Full                                                        +---------+---------------+---------+-----------+----------+--------------+ FV DistalFull                                                        +---------+---------------+---------+-----------+----------+--------------+ PFV      Full                                                        +---------+---------------+---------+-----------+----------+--------------+ POP      Full           Yes      Yes                                  +---------+---------------+---------+-----------+----------+--------------+ PTV      Full                                                        +---------+---------------+---------+-----------+----------+--------------+ PERO     Full                                                        +---------+---------------+---------+-----------+----------+--------------+    Summary: RIGHT: - There is no evidence of deep vein thrombosis in the lower extremity.  - No cystic structure found in the popliteal fossa.  LEFT: - There is no evidence of deep vein thrombosis in the lower extremity.  - No cystic structure found in the popliteal fossa.  *See table(s) above for measurements and observations.    Preliminary      LOS: 1 day   Oren Binet, MD  [facility]    To contact the attending provider between 7A-7P or the covering provider during after hours 7P-7A, please log into the web site [email or web address] and access using universal [place] password for that web site. If you do not have the password, please call the hospital operator.  12/22/2020, 11:38 AM

## 2020-12-22 NOTE — H&P (Signed)
History and Physical    Virginia Wallace SFK:[id number] DOB: Dec 13, 1960 DOA: 12/21/2020  PCP: [name], MD  Patient coming from: Home   Chief Complaint:  Chief Complaint  Patient presents with   Shortness of Breath     HPI:    60 year old female with past medical history of iron deficiency anemia, left breast cancer (Dx 1999), as well as right breast cancer (Dx 04/2019, status post B/L mastectomy/chemotherapy), obesity, moderate persistent asthma, pulmonary embolism (Dx 08/2019, thought to be secondary to [place] no longer on anticoagulation) who presents to [facility] emergency department with complaints of shortness of breath.  Patient complains of developing shortness breath approximately 1 week ago.  Shortness of breath is severe, worse with exertion and improved with rest.  Patient denies associated chest pain, cough, fevers, leg swelling or pain.  Patient states that she frequently drives between [place] but otherwise has not taken any long car rides or plane rides.  Patient denies undergoing any recent medical procedures, any recent unexplained weight loss or night sweats.  Patient states that she is regularly following up with her oncologist and surveillance for recurrent malignancy has been negative.  Patient's symptoms continued to worsen until she eventually presented to [facility] emergency department for evaluation.  Upon evaluation in the emergency department patient was initially found to have somewhat low oxygen saturation of 90% and was placed on supplemental oxygen.  CT angiogram of the chest was performed revealing bilateral pulmonary emboli with evidence of right heart strain consistent with submassive pulmonary embolism.  Troponins were obtained and were additionally found to be elevated at 204 followed by 220.  Patient was initiated on a heparin infusion and the hospitalist group was then called to assess the patient for admission to the  hospital.  Review of Systems:   Review of Systems  Respiratory:  Positive for shortness of breath.   All other systems reviewed and are negative.  Past Medical History:  Diagnosis Date   Cancer ([place])    breast canser   Class 3 severe obesity due to excess calories with serious comorbidity and body mass index (BMI) of 40.0 to 44.9 in adult [facility]) 12/22/2020   Moderate persistent asthma without complication 17/49/4496    Past Surgical History:  Procedure Laterality Date   BREAST SURGERY     FRACTURE SURGERY Right 2005   right ankle     reports that she has never smoked. She has never used smokeless tobacco. She reports that she does not drink alcohol and does not use drugs.  No Known Allergies  Family History  Problem Relation Age of Onset   Heart disease Mother    Cancer Father      Prior to Admission medications   Medication Sig Start Date End Date Taking? Authorizing Provider  albuterol (PROVENTIL HFA;VENTOLIN HFA) 108 (90 BASE) MCG/ACT inhaler Inhale 2 puffs into the lungs every 4 (four) hours as needed for wheezing or shortness of breath (cough, shortness of breath or wheezing.). 03/24/13   [name], MD  azithromycin (ZITHROMAX) 250 MG tablet Take 2 tabs PO x 1 dose, then 1 tab PO QD x 4 days 03/24/13   [name], MD  cyclobenzaprine (FLEXERIL) 10 MG tablet Take 1 tablet (10 mg total) by mouth 2 (two) times daily as needed for muscle spasms. 12/18/12   Copland, [name], MD  guaiFENesin-codeine 100-10 MG/5ML syrup Take 5-10 mLs by mouth 3 (three) times daily as needed for cough.  03/24/13   [name], MD  guaiFENesin-dextromethorphan [facility] DM) 100-10 MG/5ML syrup Take 5 mLs by mouth every 4 (four) hours as needed for cough.    [provider]  HYDROcodone-acetaminophen (NORCO/VICODIN) 5-325 MG per tablet Take 1 tablet by mouth every 8 (eight) hours as needed for pain. 12/18/12   CoplandGay Filler, MD    Physical Exam: Vitals:    12/22/20 0630 12/22/20 0645 12/22/20 0700 12/22/20 0715  BP: 97/82 117/64 129/77 113/80  Pulse: 83 90 89 84  Resp: 20 (!) 22 17 11   Temp:      TempSrc:      SpO2: 97% 98% 100% 98%  Weight:      Height:        Constitutional: Awake alert and oriented x3, no associated distress.   Skin: no rashes, no lesions, good skin turgor noted. Eyes: Pupils are equally reactive to light.  No evidence of scleral icterus or conjunctival pallor.  ENMT: Moist mucous membranes noted.  Posterior pharynx clear of any exudate or lesions.   Neck: normal, supple, no masses, no thyromegaly.  No evidence of jugular venous distension.   Respiratory: clear to auscultation bilaterally, no wheezing, no crackles. Normal respiratory effort. No accessory muscle use.  Cardiovascular: Regular rate and rhythm, no murmurs / rubs / gallops. No extremity edema. 2+ pedal pulses. No carotid bruits.  Chest:   Nontender without crepitus or deformity.   Back:   Nontender without crepitus or deformity. Abdomen: Abdomen is soft and nontender.  No evidence of intra-abdominal masses.  Positive bowel sounds noted in all quadrants.   Musculoskeletal: No joint deformity upper and lower extremities. Good ROM, no contractures. Normal muscle tone.  Neurologic: CN 2-12 grossly intact. Sensation intact.  Patient moving all 4 extremities spontaneously.  Patient is following all commands.  Patient is responsive to verbal stimuli.   Psychiatric: Patient exhibits normal mood with appropriate affect.  Patient seems to possess insight as to their current situation.     Labs on Admission: I have personally reviewed following labs and imaging studies -   CBC: Recent Labs  Lab 12/21/20 2046 12/22/20 0337  WBC 8.6 8.5  NEUTROABS 5.6  --   HGB 12.5 11.6*  HCT 37.3 33.9*  MCV 75.8* 74.5*  PLT 244 630   Basic Metabolic Panel: Recent Labs  Lab 12/21/20 2046 12/22/20 0337  NA 141 140  K 4.1 3.6  CL 104 104  CO2 26 26  GLUCOSE 134*  104*  BUN 10 8  CREATININE 0.88 0.76  CALCIUM 9.5 8.9  MG  --  1.6*   GFR: Estimated Creatinine Clearance: 89.8 mL/min (by C-G formula based on SCr of 0.76 mg/dL). Liver Function Tests: Recent Labs  Lab 12/21/20 2046 12/22/20 0337  AST 27 20  ALT 24 20  ALKPHOS 63 53  BILITOT 0.5 0.9  PROT 7.3 6.5  ALBUMIN 3.8 3.3*   No results for input(s): LIPASE, AMYLASE in the last 168 hours. No results for input(s): AMMONIA in the last 168 hours. Coagulation Profile: No results for input(s): INR, PROTIME in the last 168 hours. Cardiac Enzymes: No results for input(s): CKTOTAL, CKMB, CKMBINDEX, TROPONINI in the last 168 hours. BNP (last 3 results) No results for input(s): PROBNP in the last 8760 hours. HbA1C: No results for input(s): HGBA1C in the last 72 hours. CBG: No results for input(s): GLUCAP in the last 168 hours. Lipid Profile: No results for input(s): CHOL, HDL, LDLCALC, TRIG, CHOLHDL, LDLDIRECT in the last  72 hours. Thyroid Function Tests: No results for input(s): TSH, T4TOTAL, FREET4, T3FREE, THYROIDAB in the last 72 hours. Anemia Panel: No results for input(s): VITAMINB12, FOLATE, FERRITIN, TIBC, IRON, RETICCTPCT in the last 72 hours. Urine analysis: No results found for: COLORURINE, APPEARANCEUR, LABSPEC, PHURINE, GLUCOSEU, HGBUR, BILIRUBINUR, KETONESUR, PROTEINUR, UROBILINOGEN, NITRITE, LEUKOCYTESUR  Radiological Exams on Admission - Personally Reviewed: CT Angio Chest PE W and/or Wo Contrast  Result Date: 12/21/2020 CLINICAL DATA:  PE suspected, high prob EXAM: CT ANGIOGRAPHY CHEST WITH CONTRAST TECHNIQUE: Multidetector CT imaging of the chest was performed using the standard protocol during bolus administration of intravenous contrast. Multiplanar CT image reconstructions and MIPs were obtained to evaluate the vascular anatomy. CONTRAST:  55mL OMNIPAQUE IOHEXOL 350 MG/ML SOLN COMPARISON:  None. FINDINGS: Cardiovascular: There are numerous bilateral pulmonary emboli  involving all lobes of both lungs. Evidence of right heart strain with RV to LV ratio 1.59. Aorta normal caliber. Mediastinum/Nodes: No mediastinal, hilar, or axillary adenopathy. Trachea and esophagus are unremarkable. Lungs/Pleura: No confluent airspace opacities. Linear atelectasis in the lung bases. No effusions. Upper Abdomen: Imaging into the upper abdomen demonstrates no acute findings. Musculoskeletal: Prior right mastectomy.  No acute bony abnormality. Review of the MIP images confirms the above findings. IMPRESSION: Bilateral pulmonary emboli. Evidence of right heart strain (RV/LV Ratio = 1.59) consistent with at least submassive (intermediate risk) PE. The presence of right heart strain has been associated with an increased risk of morbidity and mortality. Please refer to the "PE Focused" order set in EPIC. Bibasilar atelectasis. Critical Value/emergent results were called by telephone at the time of interpretation on 12/21/2020 at 11:24 pm to provider Joline Maxcy , who verbally acknowledged these results. Electronically Signed   By: Rolm Baptise M.D.   On: 12/21/2020 23:29    EKG: Personally reviewed.  Rhythm is sinus tachycardia with heart rate of 112 bpm.  No dynamic ST segment changes appreciated.  Assessment/Plan  * Acute pulmonary embolism with acute cor pulmonale (HCC) CT angiogram reveals evidence of bilateral pulmonary emboli with evidence of right heart strain. Patient is currently hemodynamically stable and is on minimal oxygen supplementation via nasal cannula.  No need for emergent thrombectomy or thrombolysis at this point. Patient has been placed on Heparin infusion by ER provider which will be continued at this time and eventually will be transition to long-term oral anticoagulation. Concerning potential etiologies patient does endorse frequently driving back and forth between [place] (90 minutes) but otherwise denies recent lengthy trips, recent medical procedures, recent  unexplained weight loss or night sweats.  Patient states that she has been following with oncology regularly and has had no evidence of recurrent cancer on surveillance. Patient possibly suffers from an underlying hypercoagulable state considering this is the second time patient has developed pulmonary emboli.  Patient will likely require lifelong anticoagulation. Echocardiogram will be obtained in the morning, if right heart strain is once again demonstrated will obtain PCCM consultation for evaluation of potential thrombolysis or thrombectomy.   Monitoring patient on telemetry Monitoring cardiac enzymes which are somewhat elevated due to heart strain Supplemental oxygen to maintain oxygen saturations between 92 to 96%. As needed opiate-based analgesics for associated chest pain   Elevated troponin level not due myocardial infarction Considering presence of submassive pulmonary emboli elevated troponin is likely secondary to heart strain Plaque rupture is therefore felt to be unlikely and cardiac intervention is not indicated Troponin is already trending downward Patient is currently chest pain-free Monitoring patient on telemetry  Moderate persistent asthma  without complication No evidence of acute asthma exacerbation As needed bronchodilator therapy for shortness of breath and wheezing.   Class 3 severe obesity due to excess calories with serious comorbidity and body mass index (BMI) of 40.0 to 44.9 in adult [facility]) Once patient is clinically improving, will counsel patient on caloric restriction and regular physical activity.       Code Status:  Full code  code status decision has been confirmed with: patient Family Communication: deferred   Status is: Inpatient  Remains inpatient appropriate because: High risk of rapid clinical decompensation requiring close monitoring in the progressive unit with continuous heparin infusion, supplemental oxygen and continued work-up with possible  expert consultation.        Vernelle Emerald MD [facility] Pager [phone]  If 7PM-7AM, please contact night-coverage [email or web address] Use universal [place] password for that web site. If you do not have the password, please call the hospital operator.  12/22/2020, 8:25 AM

## 2020-12-23 DIAGNOSIS — R778 Other specified abnormalities of plasma proteins: Secondary | ICD-10-CM | POA: Diagnosis not present

## 2020-12-23 DIAGNOSIS — J4540 Moderate persistent asthma, uncomplicated: Secondary | ICD-10-CM | POA: Diagnosis not present

## 2020-12-23 DIAGNOSIS — I2609 Other pulmonary embolism with acute cor pulmonale: Secondary | ICD-10-CM | POA: Diagnosis not present

## 2020-12-23 LAB — BASIC METABOLIC PANEL
Anion gap: 5 (ref 5–15)
BUN: 12 mg/dL (ref 8–23)
CO2: 30 mmol/L (ref 22–32)
Calcium: 8.9 mg/dL (ref 8.9–10.3)
Chloride: 104 mmol/L (ref 98–111)
Creatinine, Ser: 0.87 mg/dL (ref 0.44–1.00)
GFR, Estimated: >60 mL/min (ref >60)
Glucose, Bld: 93 mg/dL (ref 70–99)
Potassium: 4.1 mmol/L (ref 3.5–5.1)
Sodium: 139 mmol/L (ref 135–145)

## 2020-12-23 LAB — HEPARIN LEVEL (UNFRACTIONATED): Heparin Unfractionated: 0.59 IU/mL (ref 0.30–0.70)

## 2020-12-23 LAB — CBC
HCT: 32.6 % — ABNORMAL LOW (ref 36.0–46.0)
Hemoglobin: 10.8 g/dL — ABNORMAL LOW (ref 12.0–15.0)
MCH: 25.1 pg — ABNORMAL LOW (ref 26.0–34.0)
MCHC: 33.1 g/dL (ref 30.0–36.0)
MCV: 75.8 fL — ABNORMAL LOW (ref 80.0–100.0)
Platelets: 195 10*3/uL (ref 150–400)
RBC: 4.3 MIL/uL (ref 3.87–5.11)
RDW: 14.6 % (ref 11.5–15.5)
WBC: 7.9 10*3/uL (ref 4.0–10.5)
nRBC: 0 % (ref 0.0–0.2)

## 2020-12-23 LAB — MAGNESIUM: Magnesium: 2 mg/dL (ref 1.7–2.4)

## 2020-12-23 MED ORDER — APIXABAN 5 MG PO TABS
10.0000 mg | ORAL_TABLET | Freq: Two times a day (BID) | ORAL | Status: DC
  Administered 2020-12-23 – 2020-12-24 (×2): 10 mg via ORAL
  Filled 2020-12-23 (×2): qty 2

## 2020-12-23 MED ORDER — APIXABAN 5 MG PO TABS: 5.0000 mg | ORAL_TABLET | Freq: Two times a day (BID) | ORAL | Status: DC

## 2020-12-23 NOTE — Progress Notes (Signed)
ANTICOAGULATION CONSULT NOTE - Initial Consult  Pharmacy Consult for Eliquis Indication: pulmonary embolus  No Known Allergies  Patient Measurements: Height: 5\' 3"  (160 cm) Weight: 105.3 kg (232 lb 2.3 oz) IBW/kg (Calculated) : 52.4  Vital Signs: Temp: 98.1 F (36.7 C) (10/22 1320) Temp Source: Oral (10/22 1320) BP: 104/50 (10/22 1320) Pulse Rate: 98 (10/22 1320)  Labs: Recent Labs    12/21/20 2046 12/21/20 2238 12/22/20 0337 12/22/20 0530 12/22/20 1330 12/23/20 0207  HGB 12.5  --  11.6*  --   --  10.8*  HCT 37.3  --  33.9*  --   --  32.6*  PLT 244  --  205  --   --  195  LABPROT  --   --   --   --  12.9  --   INR  --   --   --   --  1.0  --   HEPARINUNFRC  --   --   --  0.90* 0.62 0.59  CREATININE 0.88  --  0.76  --   --  0.87  TROPONINIHS 204* 220*  --   --   --   --     Estimated Creatinine Clearance: 78.9 mL/min (by C-G formula based on SCr of 0.87 mg/dL).   Medical History: Past Medical History:  Diagnosis Date   Cancer ([place])    breast canser   Class 3 severe obesity due to excess calories with serious comorbidity and body mass index (BMI) of 40.0 to 44.9 in adult [facility]) 12/22/2020   Moderate persistent asthma without complication 31/51/7616    Assessment:  Anticoag: acute bilateral PE with RHS (RV/LV 1.59), Dopplers negative. IV Heparin>>Eliquis on 10/22 - Hgb 11.6>10.8. age, weight, CrCl ok for full dosing  Goal of Therapy:  Therapeutic oral anticoagulation  Plan:  D/c IV heparin with first dose of Eliquis Eliquis10mg  BID x 7d then 5mg  BID likely will require lifelong AC now  Charls Custer S. Alford Highland, PharmD, BCPS Clinical Staff Pharmacist [email or web address] Alford Highland, Bailei Buist Stillinger 12/23/2020,2:05 PM

## 2020-12-23 NOTE — Progress Notes (Signed)
PROGRESS NOTE        PATIENT DETAILS Name: Virginia Wallace Age: 60 y.o. Sex: female Date of Birth: 03-30-1960 Admit Date: 12/21/2020 Admitting Physician Vernelle Emerald, MD HWE:[id number], Florentina Jenny, MD  Brief Narrative: Patient is a 60 y.o. female with history of left sided breast cancer (1991)-s/p mastectomy and subsequent reconstructive surgery-right breast cancer (2021)-s/p adjuvant chemotherapy and mastectomy-history of pulmonary embolism (June 2021-stopped Eliquis December 2021)-presenting with exertional dyspnea-found to have pulmonary embolism.  See below for further details  Subjective: Feels better-claims she has significantly less shortness of breath when she ambulated to the bathroom (when compared to before hospitalization).  She was on room air-with O2 saturation in the high 90s.  Objective: Vitals: Blood pressure (!) 104/50, pulse 98, temperature 98.1 F (36.7 C), temperature source Oral, resp. rate 17, height 5\' 3"  (1.6 m), weight 105.3 kg, SpO2 93 %.   Exam: Gen Exam:Alert awake-not in any distress HEENT:atraumatic, normocephalic Chest: B/L clear to auscultation anteriorly CVS:S1S2 regular Abdomen:soft non tender, non distended Extremities:no edema Neurology: Non focal Skin: no rash   Pertinent Labs/Radiology: WBC: 7.9 Hb: 10.8 Na: 139 K: 4.1  creatinine: 0.87  Mg: 2.0  10/20>> CT angio chest: Bilateral pulmonary emboli with evidence of RV strain. 10/21>> lower extremity Doppler: No DVT 10/21>> Echo: EF 69-67%, grade 1 diastolic dysfunction, RV systolic function is mildly reduced.  Assessment/Plan: Pulmonary embolism: Unprovoked-this is higher second episode.  Although she had CT evidence of mild RV strain-mild RV systolic dysfunction on echo-she has no clinical signs of RV failure.  She is not hypoxic-she was on room air earlier this morning.  Plan is to continue anticoagulation-however we will switch her to Eliquis today-watch  overnight-and if she does well-she can likely be discharged home tomorrow.  Patient is aware she will require indefinite anticoagulation.  Minimally elevated troponins: Trend is flat-not consistent with ACS-likely due to PE.  Hypomagnesemia: Repleted.  History of breast cancer: In remission-resume outpatient follow-up with oncology as previous  Moderate persistent asthma: No evidence of exacerbation-continue bronchodilators.  Morbid Obesity: Estimated body mass index is 41.12 kg/m as calculated from the following:   Height as of this encounter: 5\' 3"  (1.6 m).   Weight as of this encounter: 105.3 kg.     Procedures: None Consults: None DVT Prophylaxis: IV Heparin>>eliquis Code Status:Full code  Family Communication: None at bedside  Time spent: 25 minutes-Greater than 50% of this time was spent in counseling, explanation of diagnosis, planning of further management, and coordination of care.  Diet: Diet Order             Diet Heart Room service appropriate? Yes; Fluid consistency: Thin  Diet effective now                      Disposition Plan: Status is: Inpatient  Remains inpatient appropriate because: As inpatient rehab level of treatment for submassive PE.   Barriers to Discharge: Submassive PE-needs parenteral anticoagulation for at least 48 hours before transitioning to oral anticoagulation.  Needs close monitoring in case patient develops RV failure.  Antimicrobial agents: Anti-infectives (From admission, onward)    None        MEDICATIONS: Scheduled Meds:  ferrous sulfate  325 mg Oral Q breakfast   fluticasone furoate-vilanterol  1 puff Inhalation Daily   magnesium oxide  400 mg Oral Daily  Continuous Infusions:  heparin 1,250 Units/hr (12/23/20 1031)   PRN Meds:.acetaminophen **OR** acetaminophen, albuterol, melatonin, oxyCODONE-acetaminophen **OR** morphine injection, ondansetron **OR** ondansetron (ZOFRAN) IV, polyethylene glycol   I  have personally reviewed following labs and imaging studies  LABORATORY DATA: CBC: Recent Labs  Lab 12/21/20 2046 12/22/20 0337 12/23/20 0207  WBC 8.6 8.5 7.9  NEUTROABS 5.6  --   --   HGB 12.5 11.6* 10.8*  HCT 37.3 33.9* 32.6*  MCV 75.8* 74.5* 75.8*  PLT 244 205 195     Basic Metabolic Panel: Recent Labs  Lab 12/21/20 2046 12/22/20 0337 12/23/20 0207  NA 141 140 139  K 4.1 3.6 4.1  CL 104 104 104  CO2 26 26 30   GLUCOSE 134* 104* 93  BUN 10 8 12   CREATININE 0.88 0.76 0.87  CALCIUM 9.5 8.9 8.9  MG  --  1.6* 2.0     GFR: Estimated Creatinine Clearance: 78.9 mL/min (by C-G formula based on SCr of 0.87 mg/dL).  Liver Function Tests: Recent Labs  Lab 12/21/20 2046 12/22/20 0337  AST 27 20  ALT 24 20  ALKPHOS 63 53  BILITOT 0.5 0.9  PROT 7.3 6.5  ALBUMIN 3.8 3.3*    No results for input(s): LIPASE, AMYLASE in the last 168 hours. No results for input(s): AMMONIA in the last 168 hours.  Coagulation Profile: Recent Labs  Lab 12/22/20 1330  INR 1.0    Cardiac Enzymes: No results for input(s): CKTOTAL, CKMB, CKMBINDEX, TROPONINI in the last 168 hours.  BNP (last 3 results) No results for input(s): PROBNP in the last 8760 hours.  Lipid Profile: No results for input(s): CHOL, HDL, LDLCALC, TRIG, CHOLHDL, LDLDIRECT in the last 72 hours.  Thyroid Function Tests: No results for input(s): TSH, T4TOTAL, FREET4, T3FREE, THYROIDAB in the last 72 hours.  Anemia Panel: No results for input(s): VITAMINB12, FOLATE, FERRITIN, TIBC, IRON, RETICCTPCT in the last 72 hours.  Urine analysis: No results found for: COLORURINE, APPEARANCEUR, LABSPEC, PHURINE, GLUCOSEU, HGBUR, BILIRUBINUR, KETONESUR, PROTEINUR, UROBILINOGEN, NITRITE, LEUKOCYTESUR  Sepsis Labs: Lactic Acid, Venous No results found for: LATICACIDVEN  MICROBIOLOGY: Recent Results (from the past 240 hour(s))  Resp Panel by RT-PCR (Flu A&B, Covid) Nasopharyngeal Swab     Status: None   Collection Time:  12/21/20 11:39 PM   Specimen: Nasopharyngeal Swab; Nasopharyngeal(NP) swabs in vial transport medium  Result Value Ref Range Status   SARS Coronavirus 2 by RT PCR NEGATIVE NEGATIVE Final    Comment: (NOTE) SARS-CoV-2 target nucleic acids are NOT DETECTED.  The SARS-CoV-2 RNA is generally detectable in upper respiratory specimens during the acute phase of infection. The lowest concentration of SARS-CoV-2 viral copies this assay can detect is 138 copies/mL. A negative result does not preclude SARS-Cov-2 infection and should not be used as the sole basis for treatment or other patient management decisions. A negative result may occur with  improper specimen collection/handling, submission of specimen other than nasopharyngeal swab, presence of viral mutation(s) within the areas targeted by this assay, and inadequate number of viral copies(<138 copies/mL). A negative result must be combined with clinical observations, patient history, and epidemiological information. The expected result is Negative.  Fact Sheet for Patients:  EntrepreneurPulse.com.au  Fact Sheet for Healthcare Providers:  IncredibleEmployment.be  This test is no t yet approved or cleared by the [place] FDA and  has been authorized for detection and/or diagnosis of SARS-CoV-2 by FDA under an Emergency Use Authorization (EUA). This EUA will remain  in effect (meaning this test  can be used) for the duration of the COVID-19 declaration under Section 564(b)(1) of the Act, 21 U.S.C.section 360bbb-3(b)(1), unless the authorization is terminated  or revoked sooner.       Influenza A by PCR NEGATIVE NEGATIVE Final   Influenza B by PCR NEGATIVE NEGATIVE Final    Comment: (NOTE) The Xpert Xpress SARS-CoV-2/FLU/RSV plus assay is intended as an aid in the diagnosis of influenza from Nasopharyngeal swab specimens and should not be used as a sole basis for treatment. Nasal washings  and aspirates are unacceptable for Xpert Xpress SARS-CoV-2/FLU/RSV testing.  Fact Sheet for Patients: EntrepreneurPulse.com.au  Fact Sheet for Healthcare Providers: IncredibleEmployment.be  This test is not yet approved or cleared by the [place] FDA and has been authorized for detection and/or diagnosis of SARS-CoV-2 by FDA under an Emergency Use Authorization (EUA). This EUA will remain in effect (meaning this test can be used) for the duration of the COVID-19 declaration under Section 564(b)(1) of the Act, 21 U.S.C. section 360bbb-3(b)(1), unless the authorization is terminated or revoked.  Performed at [facility], [place] [address], [place], [place] [address]     RADIOLOGY STUDIES/RESULTS: CT Angio Chest PE W and/or Wo Contrast  Result Date: 12/21/2020 CLINICAL DATA:  PE suspected, high prob EXAM: CT ANGIOGRAPHY CHEST WITH CONTRAST TECHNIQUE: Multidetector CT imaging of the chest was performed using the standard protocol during bolus administration of intravenous contrast. Multiplanar CT image reconstructions and MIPs were obtained to evaluate the vascular anatomy. CONTRAST:  37mL OMNIPAQUE IOHEXOL 350 MG/ML SOLN COMPARISON:  None. FINDINGS: Cardiovascular: There are numerous bilateral pulmonary emboli involving all lobes of both lungs. Evidence of right heart strain with RV to LV ratio 1.59. Aorta normal caliber. Mediastinum/Nodes: No mediastinal, hilar, or axillary adenopathy. Trachea and esophagus are unremarkable. Lungs/Pleura: No confluent airspace opacities. Linear atelectasis in the lung bases. No effusions. Upper Abdomen: Imaging into the upper abdomen demonstrates no acute findings. Musculoskeletal: Prior right mastectomy.  No acute bony abnormality. Review of the MIP images confirms the above findings. IMPRESSION: Bilateral pulmonary emboli. Evidence of right heart strain (RV/LV Ratio = 1.59) consistent with at least submassive  (intermediate risk) PE. The presence of right heart strain has been associated with an increased risk of morbidity and mortality. Please refer to the "PE Focused" order set in EPIC. Bibasilar atelectasis. Critical Value/emergent results were called by telephone at the time of interpretation on 12/21/2020 at 11:24 pm to provider Joline Maxcy , who verbally acknowledged these results. Electronically Signed   By: Rolm Baptise M.D.   On: 12/21/2020 23:29   ECHOCARDIOGRAM COMPLETE  Result Date: 12/22/2020    ECHOCARDIOGRAM REPORT   Patient Name:   Virginia Wallace Date of Exam: 12/22/2020 Medical Rec #:  [id number]      Height:       63.0 in Accession #:    [id number]     Weight:       251.3 lb Date of Birth:  Dec 27, 1960      BSA:          2.131 m Patient Age:    63 years       BP:           118/87 mmHg Patient Gender: F              HR:           93 bpm. Exam Location:  Inpatient Procedure: 2D Echo, Cardiac Doppler and Color Doppler Indications:    Pulmonary embolus  History:        Patient has no prior history of Echocardiogram examinations.                 Signs/Symptoms:Shortness of Breath and Pylmonary embolus,                 anemia; Risk Factors:Obesity. Covid, Bilateral breast cancer,                 Asthma.  Sonographer:    Dustin Flock RDCS Referring Phys: [id number] [place]  Sonographer Comments: Technically challenging study due to limited acoustic windows and patient is morbidly obese. IMPRESSIONS  1. RV not well visualized but function appears to be mildly reduced.  2. Left ventricular ejection fraction, by estimation, is 60 to 65%. The left ventricle has normal function. The left ventricle has no regional wall motion abnormalities. There is mild left ventricular hypertrophy. Left ventricular diastolic parameters are consistent with Grade I diastolic dysfunction (impaired relaxation).  3. Right ventricular systolic function is mildly reduced. The right ventricular size is normal. There is  mildly elevated pulmonary artery systolic pressure.  4. The mitral valve is normal in structure. No evidence of mitral valve regurgitation. No evidence of mitral stenosis.  5. The aortic valve is tricuspid. Aortic valve regurgitation is not visualized. No aortic stenosis is present.  6. The inferior vena cava is normal in size with greater than 50% respiratory variability, suggesting right atrial pressure of 3 mmHg. Comparison(s): No prior Echocardiogram. FINDINGS  Left Ventricle: Left ventricular ejection fraction, by estimation, is 60 to 65%. The left ventricle has normal function. The left ventricle has no regional wall motion abnormalities. The left ventricular internal cavity size was normal in size. There is  mild left ventricular hypertrophy. Left ventricular diastolic parameters are consistent with Grade I diastolic dysfunction (impaired relaxation). Right Ventricle: The right ventricular size is normal. Right ventricular systolic function is mildly reduced. There is mildly elevated pulmonary artery systolic pressure. The tricuspid regurgitant velocity is 3.03 m/s, and with an assumed right atrial pressure of 3 mmHg, the estimated right ventricular systolic pressure is 76.1 mmHg. Left Atrium: Left atrial size was normal in size. Right Atrium: Right atrial size was normal in size. Pericardium: There is no evidence of pericardial effusion. Mitral Valve: The mitral valve is normal in structure. No evidence of mitral valve regurgitation. No evidence of mitral valve stenosis. Tricuspid Valve: The tricuspid valve is normal in structure. Tricuspid valve regurgitation is mild . No evidence of tricuspid stenosis. Aortic Valve: The aortic valve is tricuspid. Aortic valve regurgitation is not visualized. No aortic stenosis is present. Pulmonic Valve: The pulmonic valve was grossly normal. Pulmonic valve regurgitation is trivial. No evidence of pulmonic stenosis. Aorta: The aortic root is normal in size and structure.  Venous: The inferior vena cava is normal in size with greater than 50% respiratory variability, suggesting right atrial pressure of 3 mmHg. IAS/Shunts: There is left bowing of the interatrial septum, suggestive of elevated right atrial pressure. No atrial level shunt detected by color flow Doppler. Additional Comments: RV not well visualized but function appears to be mildly reduced.  LEFT VENTRICLE PLAX 2D LVIDd:         3.20 cm   Diastology LVIDs:         2.20 cm   LV e' medial:    6.64 cm/s LV PW:         1.20 cm   LV E/e' medial:  6.6 LV IVS:  1.20 cm   LV e' lateral:   9.25 cm/s LVOT diam:     1.80 cm   LV E/e' lateral: 4.7 LV SV:         41 LV SV Index:   19 LVOT Area:     2.54 cm  RIGHT VENTRICLE RV Basal diam:  3.00 cm RV S prime:     7.94 cm/s TAPSE (M-mode): 1.5 cm LEFT ATRIUM             Index        RIGHT ATRIUM           Index LA diam:        2.90 cm 1.36 cm/m   RA Area:     14.80 cm LA Vol (A2C):   26.5 ml 12.44 ml/m  RA Volume:   40.60 ml  19.05 ml/m LA Vol (A4C):   46.9 ml 22.01 ml/m LA Biplane Vol: 37.5 ml 17.60 ml/m  AORTIC VALVE LVOT Vmax:   80.30 cm/s LVOT Vmean:  56.600 cm/s LVOT VTI:    0.161 m  AORTA Ao Root diam: 3.00 cm MITRAL VALVE               TRICUSPID VALVE MV Area (PHT): 3.53 cm    TR Peak grad:   36.7 mmHg MV Decel Time: 215 msec    TR Vmax:        303.00 cm/s MV E velocity: 43.70 cm/s MV A velocity: 65.60 cm/s  SHUNTS MV E/A ratio:  0.67        Systemic VTI:  0.16 m                            Systemic Diam: 1.80 cm Kirk Ruths MD Electronically signed by Kirk Ruths MD Signature Date/Time: 12/22/2020/12:16:51 PM    Final    VAS [place] LOWER EXTREMITY VENOUS (DVT)  Result Date: 12/22/2020  Lower Venous DVT Study Patient Name:  Virginia Wallace  Date of Exam:   12/22/2020 Medical Rec #: [id number]       Accession #:    [id number] Date of Birth: December 03, 1960       Patient Gender: F Patient Age:   28 years Exam Location:  [facility] Procedure:      VAS [place] LOWER  EXTREMITY VENOUS (DVT) Referring Phys: Oren Binet --------------------------------------------------------------------------------  Indications: Pulmonary embolism.  Anticoagulation: Heparin. Comparison Study: No prior studies. Performing Technologist: Darlin Coco RDMS, RVT  Examination Guidelines: A complete evaluation includes B-mode imaging, spectral Doppler, color Doppler, and power Doppler as needed of all accessible portions of each vessel. Bilateral testing is considered an integral part of a complete examination. Limited examinations for reoccurring indications may be performed as noted. The reflux portion of the exam is performed with the patient in reverse Trendelenburg.  +---------+---------------+---------+-----------+----------+--------------+ RIGHT    CompressibilityPhasicitySpontaneityPropertiesThrombus Aging +---------+---------------+---------+-----------+----------+--------------+ CFV      Full           Yes      Yes                                 +---------+---------------+---------+-----------+----------+--------------+ SFJ      Full                                                        +---------+---------------+---------+-----------+----------+--------------+  FV Prox  Full                                                        +---------+---------------+---------+-----------+----------+--------------+ FV Mid   Full                                                        +---------+---------------+---------+-----------+----------+--------------+ FV DistalFull                                                        +---------+---------------+---------+-----------+----------+--------------+ PFV      Full                                                        +---------+---------------+---------+-----------+----------+--------------+ POP      Full           Yes      Yes                                  +---------+---------------+---------+-----------+----------+--------------+ PTV      Full                                                        +---------+---------------+---------+-----------+----------+--------------+ PERO     Full                                                        +---------+---------------+---------+-----------+----------+--------------+   +---------+---------------+---------+-----------+----------+--------------+ LEFT     CompressibilityPhasicitySpontaneityPropertiesThrombus Aging +---------+---------------+---------+-----------+----------+--------------+ CFV      Full           Yes      Yes                                 +---------+---------------+---------+-----------+----------+--------------+ SFJ      Full                                                        +---------+---------------+---------+-----------+----------+--------------+ FV Prox  Full                                                        +---------+---------------+---------+-----------+----------+--------------+  FV Mid   Full                                                        +---------+---------------+---------+-----------+----------+--------------+ FV DistalFull                                                        +---------+---------------+---------+-----------+----------+--------------+ PFV      Full                                                        +---------+---------------+---------+-----------+----------+--------------+ POP      Full           Yes      Yes                                 +---------+---------------+---------+-----------+----------+--------------+ PTV      Full                                                        +---------+---------------+---------+-----------+----------+--------------+ PERO     Full                                                         +---------+---------------+---------+-----------+----------+--------------+     Summary: RIGHT: - There is no evidence of deep vein thrombosis in the lower extremity.  - No cystic structure found in the popliteal fossa.  LEFT: - There is no evidence of deep vein thrombosis in the lower extremity.  - No cystic structure found in the popliteal fossa.  *See table(s) above for measurements and observations. Electronically signed by Deitra Mayo MD on 12/22/2020 at 12:49:27 PM.    Final      LOS: 2 days   Oren Binet, MD  [facility]    To contact the attending provider between 7A-7P or the covering provider during after hours 7P-7A, please log into the web site [email or web address] and access using universal [place] password for that web site. If you do not have the password, please call the hospital operator.  12/23/2020, 1:55 PM

## 2020-12-23 NOTE — Discharge Instructions (Addendum)
Follow with Primary MD [name], MD in 7 days   Get CBC, CMP, 2 view Chest X ray -  checked next visit within 1 week by Primary MD   Activity: As tolerated with Full fall precautions use walker/cane & assistance as needed  Disposition Home   Diet: Heart Healthy   Special Instructions: If you have smoked or chewed Tobacco  in the last 2 yrs please stop smoking, stop any regular Alcohol  and or any Recreational drug use.  On your next visit with your primary care physician please Get Medicines reviewed and adjusted.  Please request your Prim.MD to go over all Hospital Tests and Procedure/Radiological results at the follow up, please get all Hospital records sent to your Prim MD by signing hospital release before you go home.  If you experience worsening of your admission symptoms, develop shortness of breath, life threatening emergency, suicidal or homicidal thoughts you must seek medical attention immediately by calling 911 or calling your MD immediately  if symptoms less severe.  You Must read complete instructions/literature along with all the possible adverse reactions/side effects for all the Medicines you take and that have been prescribed to you. Take any new Medicines after you have completely understood and accpet all the possible adverse reactions/side effects.     Information on my medicine - ELIQUIS (apixaban)  Why was Eliquis prescribed for you? Eliquis was prescribed to treat blood clots that may have been found in the veins of your legs (deep vein thrombosis) or in your lungs (pulmonary embolism) and to reduce the risk of them occurring again.  What do You need to know about Eliquis ? The starting dose is 10 mg (two 5 mg tablets) taken TWICE daily for the FIRST SEVEN (7) DAYS, then on (enter date)  12/30/2020 PM  the dose is reduced to ONE 5 mg tablet taken TWICE daily.  Eliquis may be taken with or without food.   Try to take the dose about the same time in the  morning and in the evening. If you have difficulty swallowing the tablet whole please discuss with your pharmacist how to take the medication safely.  Take Eliquis exactly as prescribed and DO NOT stop taking Eliquis without talking to the doctor who prescribed the medication.  Stopping may increase your risk of developing a new blood clot.  Refill your prescription before you run out.  After discharge, you should have regular check-up appointments with your healthcare provider that is prescribing your Eliquis.    What do you do if you miss a dose? If a dose of ELIQUIS is not taken at the scheduled time, take it as soon as possible on the same day and twice-daily administration should be resumed. The dose should not be doubled to make up for a missed dose.  Important Safety Information A possible side effect of Eliquis is bleeding. You should call your healthcare provider right away if you experience any of the following: Bleeding from an injury or your nose that does not stop. Unusual colored urine (red or dark brown) or unusual colored stools (red or black). Unusual bruising for unknown reasons. A serious fall or if you hit your head (even if there is no bleeding).  Some medicines may interact with Eliquis and might increase your risk of bleeding or clotting while on Eliquis. To help avoid this, consult your healthcare provider or pharmacist prior to using any new prescription or non-prescription medications, including herbals, vitamins, non-steroidal anti-inflammatory drugs (NSAIDs) and  supplements.  This website has more information on Eliquis (apixaban): http://www.eliquis.com/eliquis/home

## 2020-12-23 NOTE — Progress Notes (Signed)
G. L. Garcia for heparin   Indication:  PE No Known Allergies  Patient Measurements: Height: 5\' 3"  (160 cm) Weight: 105.3 kg (232 lb 2.3 oz) IBW/kg (Calculated) : 52.4 Heparin Dosing Weight: 80kg  Vital Signs: Temp: 98.2 F (36.8 C) (10/22 0713) Temp Source: Oral (10/22 0713) BP: 107/76 (10/22 0713) Pulse Rate: 85 (10/22 0713)  Labs: Recent Labs    12/21/20 2046 12/21/20 2238 12/22/20 0337 12/22/20 0530 12/22/20 1330 12/23/20 0207  HGB 12.5  --  11.6*  --   --  10.8*  HCT 37.3  --  33.9*  --   --  32.6*  PLT 244  --  205  --   --  195  LABPROT  --   --   --   --  12.9  --   INR  --   --   --   --  1.0  --   HEPARINUNFRC  --   --   --  0.90* 0.62 0.59  CREATININE 0.88  --  0.76  --   --  0.87  TROPONINIHS 204* 220*  --   --   --   --      Estimated Creatinine Clearance: 78.9 mL/min (by C-G formula based on SCr of 0.87 mg/dL).  Assessment: 60 y.o. female with bilateral PE for heparin  Most recent heparin level therapeutic at 0.59 on heparin 1250 units/hr. CBC stable. No signs of bleeding or problems with infusion per chart review.  Goal of Therapy:  Heparin level 0.3-0.7 units/ml Monitor platelets by anticoagulation protocol: Yes   Plan:  Continue heparin at 1250 units/hr Daily heparin level, CBC Monitor s/sx of bleeding  Thank you for including pharmacy in the care of this patient.  Zenaida Deed, PharmD PGY1 Acute Care Pharmacy Resident  Phone: [phone] 12/23/2020  9:03 AM  Please check [email or web address] for unit-specific pharmacy phone numbers.

## 2020-12-24 DIAGNOSIS — I2694 Multiple subsegmental pulmonary emboli without acute cor pulmonale: Secondary | ICD-10-CM

## 2020-12-24 DIAGNOSIS — J4540 Moderate persistent asthma, uncomplicated: Secondary | ICD-10-CM | POA: Diagnosis not present

## 2020-12-24 DIAGNOSIS — R778 Other specified abnormalities of plasma proteins: Secondary | ICD-10-CM | POA: Diagnosis not present

## 2020-12-24 MED ORDER — APIXABAN 5 MG PO TABS: 10.0000 mg | ORAL_TABLET | Freq: Two times a day (BID) | ORAL | 0 refills | Status: AC

## 2020-12-24 MED ORDER — APIXABAN 5 MG PO TABS: 5.0000 mg | ORAL_TABLET | Freq: Two times a day (BID) | ORAL | 0 refills | Status: AC

## 2020-12-24 MED ORDER — MAGNESIUM OXIDE 400 MG PO TABS: 400.0000 mg | ORAL_TABLET | Freq: Every day | ORAL | 0 refills | Status: AC

## 2020-12-24 NOTE — TOC Transition Note (Signed)
Transition of Care [facility]) - CM/SW Discharge Note   Patient Details  Name: Virginia Wallace MRN: [id number] Date of Birth: 02/27/1961  Transition of Care [facility]) CM/SW Contact:  Bartholomew Crews, RN Phone Number: [phone] 12/24/2020, 9:00 AM   Clinical Narrative:     Spoke with patient at the bedside. Provided Eliquis 30 day and Copay cards. Patient stated that she had used 30 day card last year when she was prescribed medication. Advised that the 30 day card may not be accepted this time. Patient verbalized understanding.   Patient asking about when she can return to work. Encouraged patient to follow up with PCP this week to discuss her readiness to return to work.    Patient stated that she drove herself to the hospital when she was admitted. Advised that if she didn't feel like she could drive to call someone to assist.   No further TOC needs identified.   Final next level of care: Home/Self Care Barriers to Discharge: No Barriers Identified   Patient Goals and CMS Choice Patient states their goals for this hospitalization and ongoing recovery are:: return home CMS Medicare.gov Compare Post Acute Care list provided to:: Patient Choice offered to / list presented to : NA  Discharge Placement                       Discharge Plan and Services                DME Arranged: N/A DME Agency: NA       HH Arranged: NA HH Agency: NA        Social Determinants of Health (SDOH) Interventions     Readmission Risk Interventions No flowsheet data found.

## 2020-12-24 NOTE — Discharge Summary (Signed)
Virginia Wallace IAX:[id number] DOB: Sep 20, 1960 DOA: 12/21/2020  PCP: [name], MD  Admit date: 12/21/2020  Discharge date: 12/24/2020  Admitted From: Home  Disposition:  Home   Recommendations for Outpatient Follow-up:   Follow up with PCP in 1-2 weeks  PCP Please obtain BMP/CBC, 2 view CXR in 1week,  (see Discharge instructions)   PCP Please follow up on the following pending results: repeat Echo in 4-6 weeks   Home Health: None Equipment/Devices: None  Consultations: None  Discharge Condition: Stable    CODE STATUS: Full    Diet Recommendation: Heart Healthy   Diet Order             Diet - low sodium heart healthy           Diet Heart Room service appropriate? Yes; Fluid consistency: Thin  Diet effective now                    Chief Complaint  Patient presents with   Shortness of Breath     Brief history of present illness from the day of admission and additional interim summary    Patient is a 60 y.o. female with history of left sided breast cancer (1991)-s/p mastectomy and subsequent reconstructive surgery-right breast cancer (2021)-s/p adjuvant chemotherapy and mastectomy-history of pulmonary embolism (June 2021-stopped Eliquis December 2021)-presenting with exertional dyspnea-found to have pulmonary embolism                                                                   Hospital Course   Pulmonary embolism: Unprovoked-this is higher second episode.  Although she had CT evidence of mild RV strain-mild RV systolic dysfunction on echo-she has minimal if any RV starin.  She is not hypoxic-and now symptom free on RA, was on IV Heparin >> Eliquis likely lifelong, will DC home with PCP and Onc follow up, PCP to repeat TTE in 4 weeks.   Minimally elevated troponins: Trend is flat-not  consistent with ACS-likely due to PE.   Hypomagnesemia: Repleted.   History of breast cancer: In remission-resume outpatient follow-up with oncology.   Moderate persistent asthma: No evidence of exacerbation-continue bronchodilators.   Morbid Obesity: Estimated body mass index is 41, follow with PCP.   Discharge diagnosis     Principal Problem:   Acute pulmonary embolism with acute cor pulmonale (HCC) Active Problems:   Moderate persistent asthma without complication   Class 3 severe obesity due to excess calories with serious comorbidity and body mass index (BMI) of 40.0 to 44.9 in adult [facility])   Elevated troponin level not due myocardial infarction    Discharge instructions    Discharge Instructions     Diet - low sodium heart healthy   Complete by: As directed    Discharge instructions  Complete by: As directed    Follow with Primary MD [name], MD in 7 days   Get CBC, CMP, 2 view Chest X ray -  checked next visit within 1 week by Primary MD   Activity: As tolerated with Full fall precautions use walker/cane & assistance as needed  Disposition Home   Diet: Heart Healthy   Special Instructions: If you have smoked or chewed Tobacco  in the last 2 yrs please stop smoking, stop any regular Alcohol  and or any Recreational drug use.  On your next visit with your primary care physician please Get Medicines reviewed and adjusted.  Please request your Prim.MD to go over all Hospital Tests and Procedure/Radiological results at the follow up, please get all Hospital records sent to your Prim MD by signing hospital release before you go home.  If you experience worsening of your admission symptoms, develop shortness of breath, life threatening emergency, suicidal or homicidal thoughts you must seek medical attention immediately by calling 911 or calling your MD immediately  if symptoms less severe.  You Must read complete instructions/literature along with all the  possible adverse reactions/side effects for all the Medicines you take and that have been prescribed to you. Take any new Medicines after you have completely understood and accpet all the possible adverse reactions/side effects.   Increase activity slowly   Complete by: As directed        Discharge Medications   Allergies as of 12/24/2020   No Known Allergies      Medication List     STOP taking these medications    azithromycin 250 MG tablet Commonly known as: ZITHROMAX   cyclobenzaprine 10 MG tablet Commonly known as: FLEXERIL   guaiFENesin-codeine 100-10 MG/5ML syrup   HYDROcodone-acetaminophen 5-325 MG tablet Commonly known as: NORCO/VICODIN       TAKE these medications    albuterol 108 (90 Base) MCG/ACT inhaler Commonly known as: VENTOLIN HFA Inhale 2 puffs into the lungs every 4 (four) hours as needed for wheezing or shortness of breath (cough, shortness of breath or wheezing.).   apixaban 5 MG Tabs tablet Commonly known as: ELIQUIS Take 2 tablets (10 mg total) by mouth 2 (two) times daily for 5 days.   apixaban 5 MG Tabs tablet Commonly known as: ELIQUIS Take 1 tablet (5 mg total) by mouth 2 (two) times daily. Start taking on: December 30, 2020   Breo Ellipta 100-25 MCG/ACT Aepb Generic drug: fluticasone furoate-vilanterol Inhale 1 puff into the lungs daily.   ferrous sulfate 325 (65 FE) MG EC tablet Take 325 mg by mouth daily with breakfast.   magnesium oxide 400 MG tablet Commonly known as: MAG-OX Take 1 tablet (400 mg total) by mouth daily.         Follow-up Information     [name], MD. Schedule an appointment as soon as possible for a visit in 1 week(s).   Specialty: Family Medicine Contact information: [address] [place] [address] [phone]                 Major procedures and Radiology Reports - PLEASE review detailed and final reports thoroughly  -      CT Angio Chest PE W and/or Wo Contrast  Result  Date: 12/21/2020 CLINICAL DATA:  PE suspected, high prob EXAM: CT ANGIOGRAPHY CHEST WITH CONTRAST TECHNIQUE: Multidetector CT imaging of the chest was performed using the standard protocol during bolus administration of intravenous contrast. Multiplanar CT image reconstructions and  MIPs were obtained to evaluate the vascular anatomy. CONTRAST:  51mL OMNIPAQUE IOHEXOL 350 MG/ML SOLN COMPARISON:  None. FINDINGS: Cardiovascular: There are numerous bilateral pulmonary emboli involving all lobes of both lungs. Evidence of right heart strain with RV to LV ratio 1.59. Aorta normal caliber. Mediastinum/Nodes: No mediastinal, hilar, or axillary adenopathy. Trachea and esophagus are unremarkable. Lungs/Pleura: No confluent airspace opacities. Linear atelectasis in the lung bases. No effusions. Upper Abdomen: Imaging into the upper abdomen demonstrates no acute findings. Musculoskeletal: Prior right mastectomy.  No acute bony abnormality. Review of the MIP images confirms the above findings. IMPRESSION: Bilateral pulmonary emboli. Evidence of right heart strain (RV/LV Ratio = 1.59) consistent with at least submassive (intermediate risk) PE. The presence of right heart strain has been associated with an increased risk of morbidity and mortality. Please refer to the "PE Focused" order set in EPIC. Bibasilar atelectasis. Critical Value/emergent results were called by telephone at the time of interpretation on 12/21/2020 at 11:24 pm to provider Joline Maxcy , who verbally acknowledged these results. Electronically Signed   By: Rolm Baptise M.D.   On: 12/21/2020 23:29   ECHOCARDIOGRAM COMPLETE  Result Date: 12/22/2020    ECHOCARDIOGRAM REPORT   Patient Name:   Virginia Wallace Date of Exam: 12/22/2020 Medical Rec #:  [id number]      Height:       63.0 in Accession #:    [id number]     Weight:       251.3 lb Date of Birth:  Jun 25, 1960      BSA:          2.131 m Patient Age:    60 years       BP:           118/87 mmHg Patient  Gender: F              HR:           93 bpm. Exam Location:  Inpatient Procedure: 2D Echo, Cardiac Doppler and Color Doppler Indications:    Pulmonary embolus  History:        Patient has no prior history of Echocardiogram examinations.                 Signs/Symptoms:Shortness of Breath and Pylmonary embolus,                 anemia; Risk Factors:Obesity. Covid, Bilateral breast cancer,                 Asthma.  Sonographer:    Dustin Flock RDCS Referring Phys: [id number] [place]  Sonographer Comments: Technically challenging study due to limited acoustic windows and patient is morbidly obese. IMPRESSIONS  1. RV not well visualized but function appears to be mildly reduced.  2. Left ventricular ejection fraction, by estimation, is 60 to 65%. The left ventricle has normal function. The left ventricle has no regional wall motion abnormalities. There is mild left ventricular hypertrophy. Left ventricular diastolic parameters are consistent with Grade I diastolic dysfunction (impaired relaxation).  3. Right ventricular systolic function is mildly reduced. The right ventricular size is normal. There is mildly elevated pulmonary artery systolic pressure.  4. The mitral valve is normal in structure. No evidence of mitral valve regurgitation. No evidence of mitral stenosis.  5. The aortic valve is tricuspid. Aortic valve regurgitation is not visualized. No aortic stenosis is present.  6. The inferior vena cava is normal in size with greater than 50% respiratory variability, suggesting right  atrial pressure of 3 mmHg. Comparison(s): No prior Echocardiogram. FINDINGS  Left Ventricle: Left ventricular ejection fraction, by estimation, is 60 to 65%. The left ventricle has normal function. The left ventricle has no regional wall motion abnormalities. The left ventricular internal cavity size was normal in size. There is  mild left ventricular hypertrophy. Left ventricular diastolic parameters are consistent with Grade  I diastolic dysfunction (impaired relaxation). Right Ventricle: The right ventricular size is normal. Right ventricular systolic function is mildly reduced. There is mildly elevated pulmonary artery systolic pressure. The tricuspid regurgitant velocity is 3.03 m/s, and with an assumed right atrial pressure of 3 mmHg, the estimated right ventricular systolic pressure is 27.2 mmHg. Left Atrium: Left atrial size was normal in size. Right Atrium: Right atrial size was normal in size. Pericardium: There is no evidence of pericardial effusion. Mitral Valve: The mitral valve is normal in structure. No evidence of mitral valve regurgitation. No evidence of mitral valve stenosis. Tricuspid Valve: The tricuspid valve is normal in structure. Tricuspid valve regurgitation is mild . No evidence of tricuspid stenosis. Aortic Valve: The aortic valve is tricuspid. Aortic valve regurgitation is not visualized. No aortic stenosis is present. Pulmonic Valve: The pulmonic valve was grossly normal. Pulmonic valve regurgitation is trivial. No evidence of pulmonic stenosis. Aorta: The aortic root is normal in size and structure. Venous: The inferior vena cava is normal in size with greater than 50% respiratory variability, suggesting right atrial pressure of 3 mmHg. IAS/Shunts: There is left bowing of the interatrial septum, suggestive of elevated right atrial pressure. No atrial level shunt detected by color flow Doppler. Additional Comments: RV not well visualized but function appears to be mildly reduced.  LEFT VENTRICLE PLAX 2D LVIDd:         3.20 cm   Diastology LVIDs:         2.20 cm   LV e' medial:    6.64 cm/s LV PW:         1.20 cm   LV E/e' medial:  6.6 LV IVS:        1.20 cm   LV e' lateral:   9.25 cm/s LVOT diam:     1.80 cm   LV E/e' lateral: 4.7 LV SV:         41 LV SV Index:   19 LVOT Area:     2.54 cm  RIGHT VENTRICLE RV Basal diam:  3.00 cm RV S prime:     7.94 cm/s TAPSE (M-mode): 1.5 cm LEFT ATRIUM             Index         RIGHT ATRIUM           Index LA diam:        2.90 cm 1.36 cm/m   RA Area:     14.80 cm LA Vol (A2C):   26.5 ml 12.44 ml/m  RA Volume:   40.60 ml  19.05 ml/m LA Vol (A4C):   46.9 ml 22.01 ml/m LA Biplane Vol: 37.5 ml 17.60 ml/m  AORTIC VALVE LVOT Vmax:   80.30 cm/s LVOT Vmean:  56.600 cm/s LVOT VTI:    0.161 m  AORTA Ao Root diam: 3.00 cm MITRAL VALVE               TRICUSPID VALVE MV Area (PHT): 3.53 cm    TR Peak grad:   36.7 mmHg MV Decel Time: 215 msec    TR Vmax:  303.00 cm/s MV E velocity: 43.70 cm/s MV A velocity: 65.60 cm/s  SHUNTS MV E/A ratio:  0.67        Systemic VTI:  0.16 m                            Systemic Diam: 1.80 cm Kirk Ruths MD Electronically signed by Kirk Ruths MD Signature Date/Time: 12/22/2020/12:16:51 PM    Final    VAS [place] LOWER EXTREMITY VENOUS (DVT)  Result Date: 12/22/2020  Lower Venous DVT Study Patient Name:  Virginia Wallace  Date of Exam:   12/22/2020 Medical Rec #: [id number]       Accession #:    [id number] Date of Birth: 11-21-1960       Patient Gender: F Patient Age:   59 years Exam Location:  [facility] Procedure:      VAS [place] LOWER EXTREMITY VENOUS (DVT) Referring Phys: Oren Binet --------------------------------------------------------------------------------  Indications: Pulmonary embolism.  Anticoagulation: Heparin. Comparison Study: No prior studies. Performing Technologist: Darlin Coco RDMS, RVT  Examination Guidelines: A complete evaluation includes B-mode imaging, spectral Doppler, color Doppler, and power Doppler as needed of all accessible portions of each vessel. Bilateral testing is considered an integral part of a complete examination. Limited examinations for reoccurring indications may be performed as noted. The reflux portion of the exam is performed with the patient in reverse Trendelenburg.  +---------+---------------+---------+-----------+----------+--------------+ RIGHT     CompressibilityPhasicitySpontaneityPropertiesThrombus Aging +---------+---------------+---------+-----------+----------+--------------+ CFV      Full           Yes      Yes                                 +---------+---------------+---------+-----------+----------+--------------+ SFJ      Full                                                        +---------+---------------+---------+-----------+----------+--------------+ FV Prox  Full                                                        +---------+---------------+---------+-----------+----------+--------------+ FV Mid   Full                                                        +---------+---------------+---------+-----------+----------+--------------+ FV DistalFull                                                        +---------+---------------+---------+-----------+----------+--------------+ PFV      Full                                                        +---------+---------------+---------+-----------+----------+--------------+  POP      Full           Yes      Yes                                 +---------+---------------+---------+-----------+----------+--------------+ PTV      Full                                                        +---------+---------------+---------+-----------+----------+--------------+ PERO     Full                                                        +---------+---------------+---------+-----------+----------+--------------+   +---------+---------------+---------+-----------+----------+--------------+ LEFT     CompressibilityPhasicitySpontaneityPropertiesThrombus Aging +---------+---------------+---------+-----------+----------+--------------+ CFV      Full           Yes      Yes                                 +---------+---------------+---------+-----------+----------+--------------+ SFJ      Full                                                         +---------+---------------+---------+-----------+----------+--------------+ FV Prox  Full                                                        +---------+---------------+---------+-----------+----------+--------------+ FV Mid   Full                                                        +---------+---------------+---------+-----------+----------+--------------+ FV DistalFull                                                        +---------+---------------+---------+-----------+----------+--------------+ PFV      Full                                                        +---------+---------------+---------+-----------+----------+--------------+ POP      Full           Yes      Yes                                 +---------+---------------+---------+-----------+----------+--------------+  PTV      Full                                                        +---------+---------------+---------+-----------+----------+--------------+ PERO     Full                                                        +---------+---------------+---------+-----------+----------+--------------+     Summary: RIGHT: - There is no evidence of deep vein thrombosis in the lower extremity.  - No cystic structure found in the popliteal fossa.  LEFT: - There is no evidence of deep vein thrombosis in the lower extremity.  - No cystic structure found in the popliteal fossa.  *See table(s) above for measurements and observations. Electronically signed by Deitra Mayo MD on 12/22/2020 at 12:49:27 PM.    Final       Today   Subjective    Virginia Wallace today has no headache,no chest abdominal pain,no new weakness tingling or numbness, feels much better wants to go home today.   Objective   Blood pressure 126/84, pulse 88, temperature 98.4 F (36.9 C), temperature source Oral, resp. rate 20, height 5\' 3"  (1.6 m), weight 105.3 kg, SpO2 97 %.   Intake/Output  Summary (Last 24 hours) at 12/24/2020 1230 Last data filed at 12/24/2020 0300 Gross per 24 hour  Intake 609.57 ml  Output --  Net 609.57 ml    Exam  Awake Alert, No new F.N deficits, Normal affect Andrews.AT,PERRAL Supple Neck,No JVD, No cervical lymphadenopathy appriciated.  Symmetrical Chest wall movement, Good air movement bilaterally, CTAB RRR,No Gallops,Rubs or new Murmurs, No Parasternal Heave +ve B.Sounds, Abd Soft, Non tender, No organomegaly appriciated, No rebound -guarding or rigidity. No Cyanosis, Clubbing or edema, No new Rash or bruise   Data Review   CBC w Diff:  Lab Results  Component Value Date   WBC 7.9 12/23/2020   HGB 10.8 (L) 12/23/2020   HCT 32.6 (L) 12/23/2020   PLT 195 12/23/2020   LYMPHOPCT 29 12/21/2020   MONOPCT 5 12/21/2020   EOSPCT 1 12/21/2020   BASOPCT 0 12/21/2020    CMP:  Lab Results  Component Value Date   NA 139 12/23/2020   K 4.1 12/23/2020   CL 104 12/23/2020   CO2 30 12/23/2020   BUN 12 12/23/2020   CREATININE 0.87 12/23/2020   PROT 6.5 12/22/2020   ALBUMIN 3.3 (L) 12/22/2020   BILITOT 0.9 12/22/2020   ALKPHOS 53 12/22/2020   AST 20 12/22/2020   ALT 20 12/22/2020  .   Total Time in preparing paper work, data evaluation and todays exam - 44 minutes  Lala Lund M.D on 12/24/2020 at 12:30 PM  [facility]

## 2021-12-09 IMAGING — CT CT ANGIO CHEST
2 of 6 series · 18 of 36 positions shown · IV contrast (omnipaque)
Comparison: None.

CLINICAL DATA: PE suspected, high prob

EXAM:
CT ANGIOGRAPHY CHEST WITH CONTRAST
TECHNIQUE: Multidetector CT imaging of the chest was performed using the
standard protocol during bolus administration of intravenous
contrast. Multiplanar CT image reconstructions and MIPs were
obtained to evaluate the vascular anatomy.
CONTRAST:  80mL OMNIPAQUE IOHEXOL 350 MG/ML SOLN

[Series 7: pe thins · axial · 0.63mm/px · z∈[-660,-430]mm · 17 of 366 slices shown]
[im 19/366  lung]
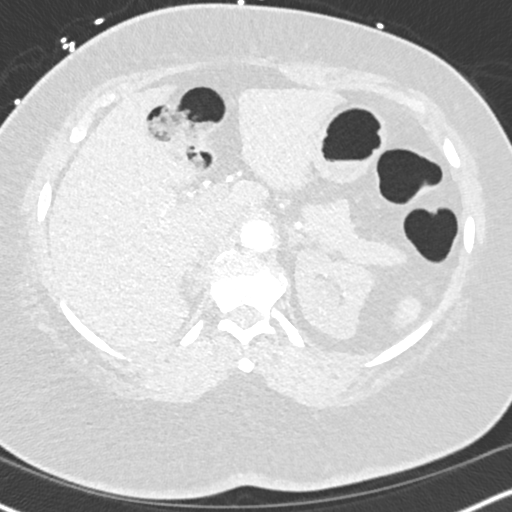
[im 37/366  mediastinal]
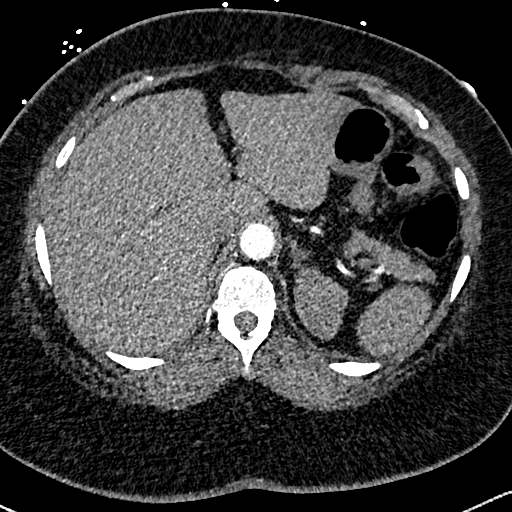
[im 55/366  lung]
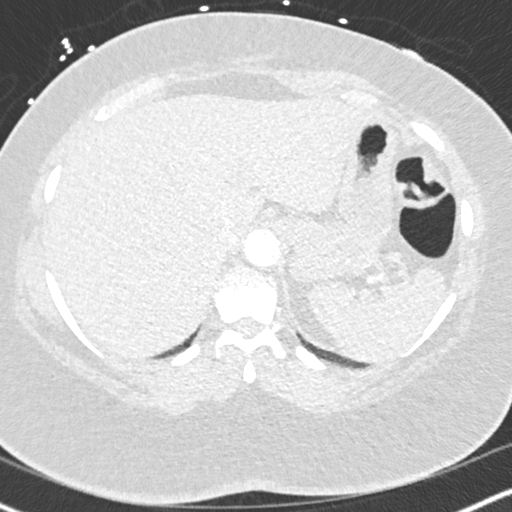
[im 74/366  mediastinal]
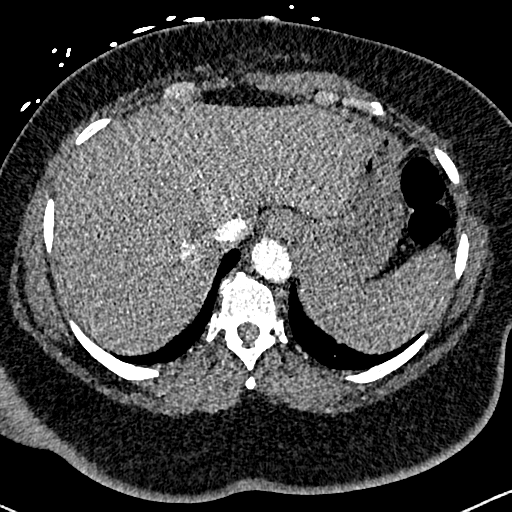
[im 110/366  lung]
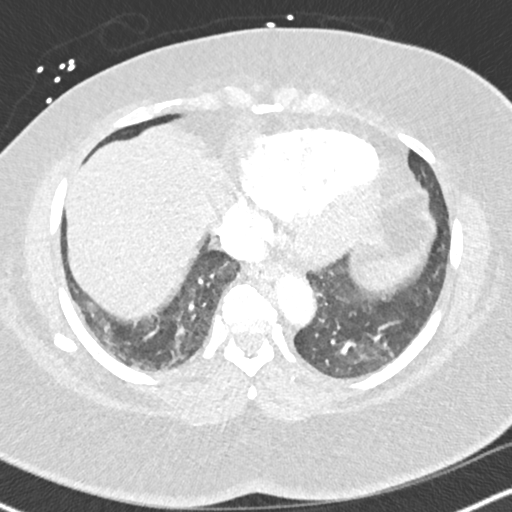
[im 128/366  mediastinal]
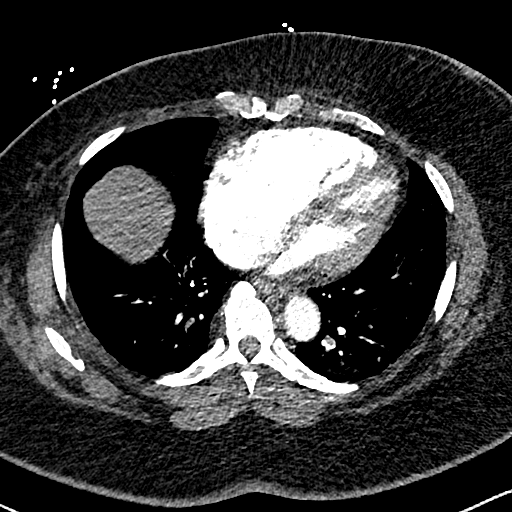
[im 147/366  lung]
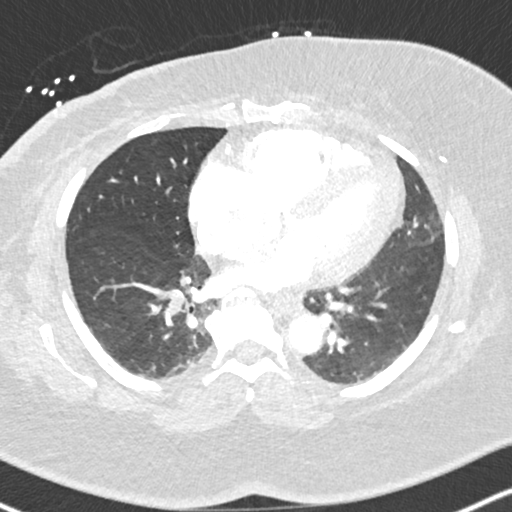
[im 165/366  mediastinal]
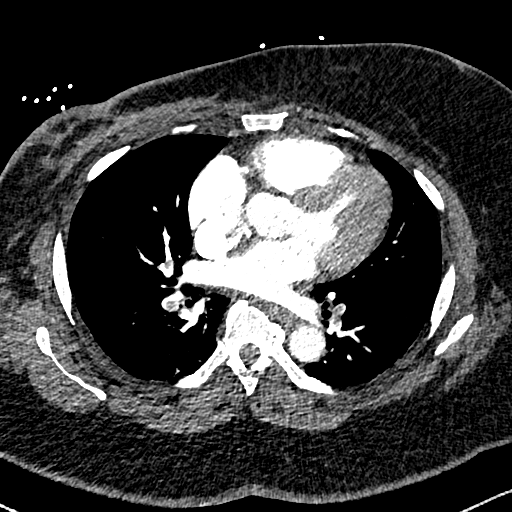
[im 183/366  lung]
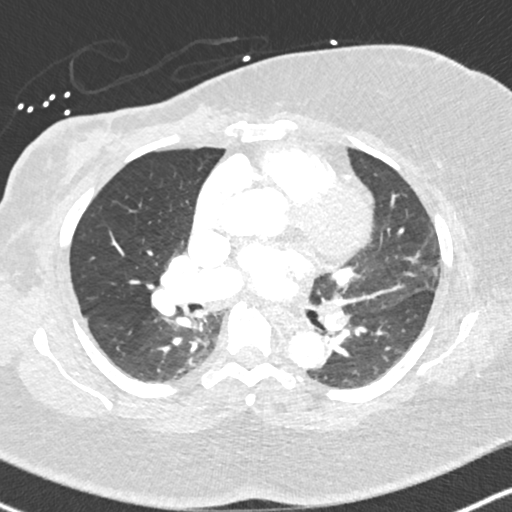
[im 201/366  mediastinal]
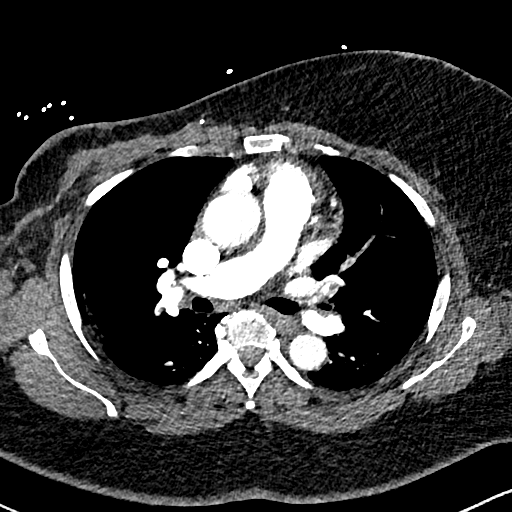
[im 220/366  lung]
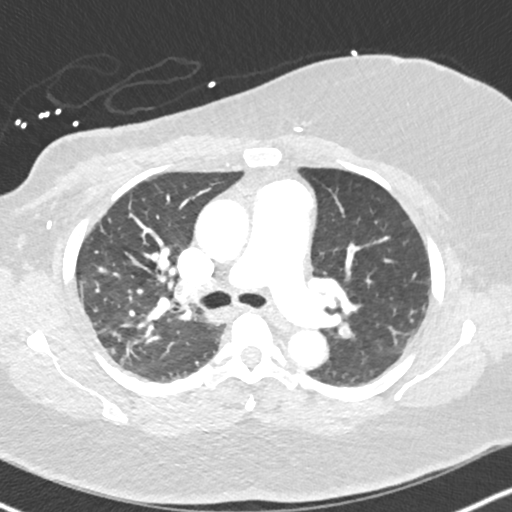
[im 238/366  mediastinal]
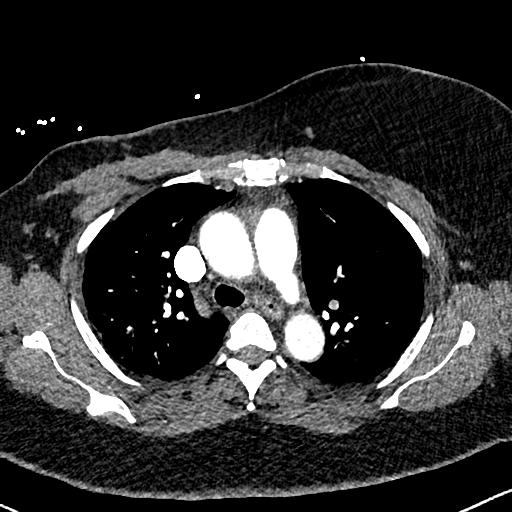
[im 256/366  lung]
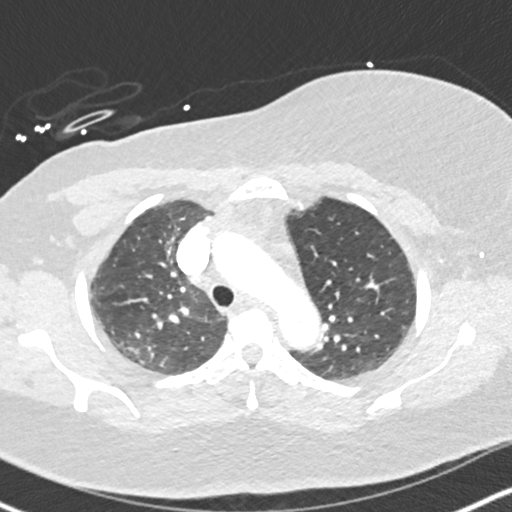
[im 293/366  mediastinal]
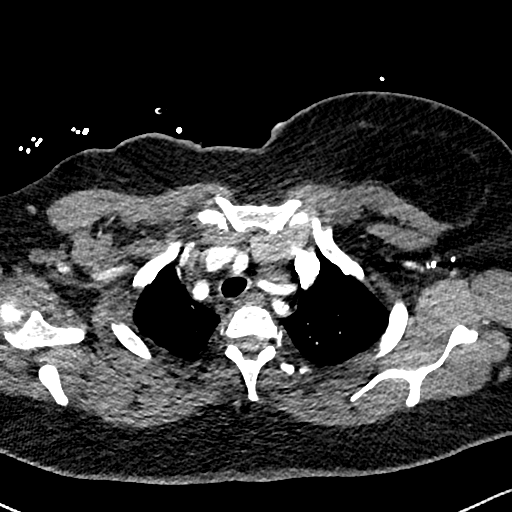
[im 311/366  lung]
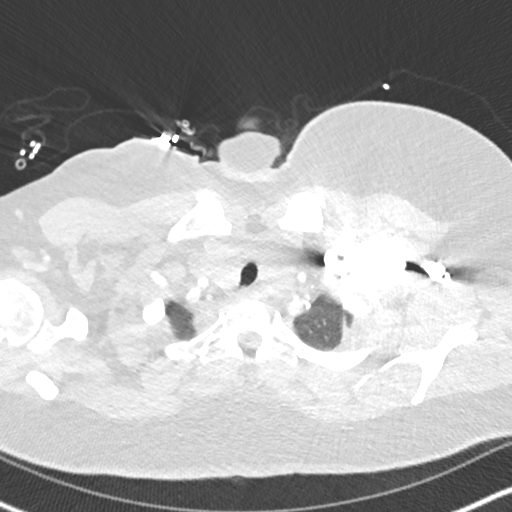
[im 329/366  mediastinal]
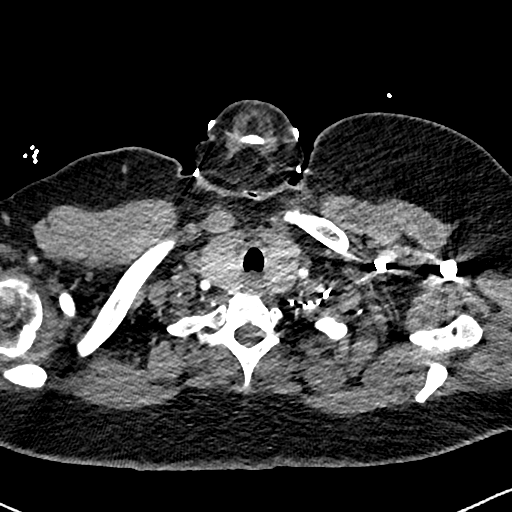
[im 347/366  lung]
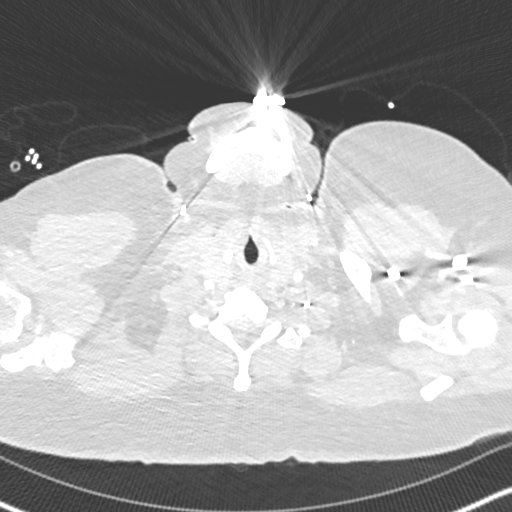

[Series 8: pe 2mm cor · coronal · 0.50mm/px · 1 of 104 slices shown]
[im 52/104  mediastinal]
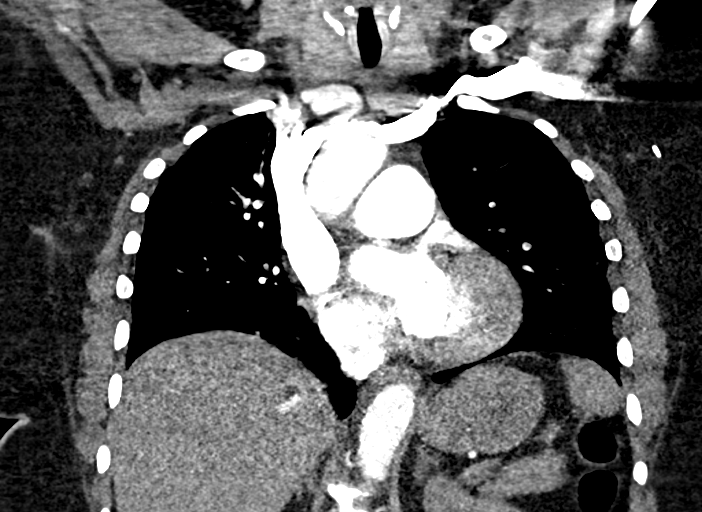

[18 of 36 positions shown; findings below may reference images not displayed]

FINDINGS: Cardiovascular: There are numerous bilateral pulmonary emboli
involving all lobes of both lungs. Evidence of right heart strain
with RV to LV ratio 1.59. Aorta normal caliber.

Mediastinum/Nodes: No mediastinal, hilar, or axillary adenopathy.
Trachea and esophagus are unremarkable.

Lungs/Pleura: No confluent airspace opacities. Linear atelectasis in
the lung bases. No effusions.

Upper Abdomen: Imaging into the upper abdomen demonstrates no acute
findings.

Musculoskeletal: Prior right mastectomy.  No acute bony abnormality.

Review of the MIP images confirms the above findings.
IMPRESSION: Bilateral pulmonary emboli. Evidence of right heart strain (RV/LV
Ratio = 1.59) consistent with at least submassive (intermediate
risk) PE. The presence of right heart strain has been associated
with an increased risk of morbidity and mortality. Please refer to
the "PE Focused" order set in [REDACTED].

Bibasilar atelectasis.

Critical Value/emergent results were called by telephone at the time
of interpretation on 12/21/2020 at [DATE] to provider Sierra Saito
, who verbally acknowledged these results.

## 2022-10-03 DEATH — deceased
